# Patient Record
Sex: Female | Born: 1975 | Race: Black or African American | Hispanic: No | Marital: Married
Health system: Southern US, Community
[De-identification: ages and names within clinical notes are randomized; demographics above are authoritative.]

## PROBLEM LIST (undated history)

## (undated) DIAGNOSIS — I82409 Acute embolism and thrombosis of unspecified deep veins of unspecified lower extremity: Secondary | ICD-10-CM

## (undated) DIAGNOSIS — E079 Disorder of thyroid, unspecified: Secondary | ICD-10-CM

## (undated) DIAGNOSIS — Z6830 Body mass index (BMI) 30.0-30.9, adult: Secondary | ICD-10-CM

## (undated) HISTORY — DX: Body mass index (BMI) 30.0-30.9, adult: Z68.30

## (undated) HISTORY — PX: TUBAL LIGATION: SHX77

## (undated) HISTORY — DX: Disorder of thyroid, unspecified: E07.9

---

## 2019-06-13 ENCOUNTER — Ambulatory Visit: Payer: Self-pay | Admitting: Family Medicine

## 2019-06-16 ENCOUNTER — Ambulatory Visit: Payer: Self-pay | Admitting: Family Medicine

## 2019-06-17 ENCOUNTER — Encounter: Payer: Self-pay | Admitting: Family Medicine

## 2019-07-21 ENCOUNTER — Ambulatory Visit (INDEPENDENT_AMBULATORY_CARE_PROVIDER_SITE_OTHER): Payer: Self-pay | Admitting: Family Medicine

## 2019-07-21 ENCOUNTER — Encounter: Payer: Self-pay | Admitting: Family Medicine

## 2019-07-21 ENCOUNTER — Other Ambulatory Visit: Payer: Self-pay

## 2019-07-21 VITALS — BP 129/87 | HR 83 | Temp 97.9°F | Ht 66.0 in | Wt 184.0 lb

## 2019-07-21 DIAGNOSIS — R7303 Prediabetes: Secondary | ICD-10-CM

## 2019-07-21 DIAGNOSIS — E049 Nontoxic goiter, unspecified: Secondary | ICD-10-CM

## 2019-07-21 DIAGNOSIS — Z8639 Personal history of other endocrine, nutritional and metabolic disease: Secondary | ICD-10-CM

## 2019-07-21 NOTE — Progress Notes (Signed)
5/3/20212:50 PM  Gabriella Thomas 09/14/1975, 44 y.o., female [id number]  Chief Complaint  Patient presents with  . Hypothyroidism    does not recall medication she has taken in the past  . New Patient (Initial Visit)    HPI:   Patient is a 44 y.o. female with past medical history significant for hyperthyroidism, GDM who presents today to establish care  Moved July 2020 from [place] to [place] for her husband's work Has 4 children: twin boys 1, 2 daughter 62 and 58 Stays at home  Last OV with PCP was told she was borderline  Her previous endocrinologist:  Dr Randa Evens _____ [phone] Per last pharmacy: methimazole 10mg  BID Last took medication over 6 months ago Denies any h/o thyroid surgeries or nodules  Her PCP: [facility] MD [phone]  S/p BTL in 2011 Last pap "long-time" ago Menses regular, normal flow   Depression screen PHQ 2/9 07/21/2019  Decreased Interest 0  Down, Depressed, Hopeless 0  PHQ - 2 Score 0    Fall Risk  07/21/2019  Falls in the past year? 0  Number falls in past yr: 0  Injury with Fall? 0  Follow up Falls evaluation completed     No Known Allergies  Prior to Admission medications   Not on File    Past Medical History:  Diagnosis Date  . Thyroid disease     Past Surgical History:  Procedure Laterality Date  . CESAREAN SECTION  07/12/2005   06/12/2008, 11/15/2009    Social History   Tobacco Use  . Smoking status: Never Smoker  . Smokeless tobacco: Never Used  Substance Use Topics  . Alcohol use: Never    History reviewed. No pertinent family history.  Review of Systems  Constitutional: Positive for malaise/fatigue. Negative for chills, diaphoresis and fever.  Eyes: Negative for blurred vision and double vision.  Respiratory: Negative for cough and shortness of breath.   Cardiovascular: Positive for palpitations (intermittently). Negative for chest pain and leg swelling.  Gastrointestinal:  Negative for abdominal pain, blood in stool, constipation, diarrhea, melena, nausea and vomiting.  Genitourinary: Negative for frequency and urgency.  Skin:       Hair loss, brittle nails  Neurological: Positive for dizziness. Negative for tingling, focal weakness and headaches.  Endo/Heme/Allergies: Negative for polydipsia.  Psychiatric/Behavioral: Negative for depression. The patient is not nervous/anxious and does not have insomnia.      OBJECTIVE:  Today's Vitals   07/21/19 1435  BP: 129/87  Pulse: 83  Temp: 97.9 F (36.6 C)  SpO2: 99%  Weight: 184 lb (83.5 kg)  Height: 5\' 6"  (1.676 m)   Body mass index is 29.7 kg/m.   Physical Exam Vitals and nursing note reviewed.  Constitutional:      Appearance: She is well-developed.  HENT:     Head: Normocephalic and atraumatic.     Mouth/Throat:     Pharynx: No oropharyngeal exudate.  Eyes:     General: No scleral icterus.    Conjunctiva/sclera: Conjunctivae normal.     Pupils: Pupils are equal, round, and reactive to light.  Neck:     Thyroid: Thyromegaly present. No thyroid mass or thyroid tenderness.  Cardiovascular:     Rate and Rhythm: Normal rate and regular rhythm.     Heart sounds: Normal heart sounds. No murmur. No friction rub. No gallop.   Pulmonary:     Effort: Pulmonary effort is normal.     Breath sounds: Normal breath sounds. No  wheezing or rales.  Musculoskeletal:     Cervical back: Neck supple.     Right lower leg: No edema.     Left lower leg: No edema.  Lymphadenopathy:     Cervical: No cervical adenopathy.  Skin:    General: Skin is warm and dry.     Comments: Diffuse hair thinning and breakage, no scarring  Neurological:     Mental Status: She is alert and oriented to person, place, and time.     No results found for this or any previous visit (from the past 24 hour(s)).  No results found.   ASSESSMENT and PLAN  1. History of hyperthyroidism - TSH - T4, Free  2. Goiter - US  THYROID; Future  3. Prediabetes - Hemoglobin A1c  PMH, PSH, meds, allergies, Fhx, Shx, reviewed with patient today. Records to be requested.   Return in about 3 months (around 10/21/2019) for CPE with pap.    Rutherford Guys, MD [facility] [place], [place] [address] Ph.  [phone] Fax [phone]

## 2019-07-21 NOTE — Patient Instructions (Addendum)
  Rogaine (over the counter) lotion for hair growth   If you have lab work done today you will be contacted with your lab results within the next 2 weeks.  If you have not heard from [place] then please contact us. The fastest way to get your results is to register for My Chart.   IF you received an x-ray today, you will receive an invoice from [facility]. Please contact [facility] at [phone] with questions or concerns regarding your invoice.   IF you received labwork today, you will receive an invoice from [place] [facility]. Please contact [facility] at [phone] with questions or concerns regarding your invoice.   Our billing staff will not be able to assist you with questions regarding bills from these companies.  You will be contacted with the lab results as soon as they are available. The fastest way to get your results is to activate your My Chart account. Instructions are located on the last page of this paperwork. If you have not heard from [place] regarding the results in 2 weeks, please contact this office.

## 2019-07-22 LAB — T4, FREE: Free T4: 1.3 ng/dL (ref 0.82–1.77)

## 2019-07-22 LAB — TSH: TSH: 1.78 u[IU]/mL (ref 0.450–4.500)

## 2019-07-22 LAB — HEMOGLOBIN A1C
Est. average glucose Bld gHb Est-mCnc: 117 mg/dL
Hgb A1c MFr Bld: 5.7 % — ABNORMAL HIGH (ref 4.8–5.6)

## 2019-08-01 ENCOUNTER — Other Ambulatory Visit: Payer: Self-pay

## 2019-08-05 ENCOUNTER — Other Ambulatory Visit: Payer: Self-pay

## 2019-08-14 ENCOUNTER — Other Ambulatory Visit: Payer: Self-pay

## 2019-10-13 ENCOUNTER — Emergency Department (HOSPITAL_COMMUNITY)
Admission: EM | Admit: 2019-10-13 | Discharge: 2019-10-13 | Disposition: A | Discharge status: Home / Self Care | Payer: Self-pay | Attending: Emergency Medicine | Admitting: Emergency Medicine

## 2019-10-13 ENCOUNTER — Emergency Department (HOSPITAL_BASED_OUTPATIENT_CLINIC_OR_DEPARTMENT_OTHER): Payer: Self-pay

## 2019-10-13 ENCOUNTER — Other Ambulatory Visit: Payer: Self-pay

## 2019-10-13 ENCOUNTER — Encounter (HOSPITAL_COMMUNITY): Payer: Self-pay | Admitting: Emergency Medicine

## 2019-10-13 DIAGNOSIS — I82491 Acute embolism and thrombosis of other specified deep vein of right lower extremity: Secondary | ICD-10-CM | POA: Insufficient documentation

## 2019-10-13 DIAGNOSIS — M79609 Pain in unspecified limb: Secondary | ICD-10-CM

## 2019-10-13 DIAGNOSIS — M722 Plantar fascial fibromatosis: Secondary | ICD-10-CM | POA: Insufficient documentation

## 2019-10-13 DIAGNOSIS — M7989 Other specified soft tissue disorders: Secondary | ICD-10-CM

## 2019-10-13 LAB — CBC
HCT: 40.8 % (ref 36.0–46.0)
Hemoglobin: 12.7 g/dL (ref 12.0–15.0)
MCH: 28.4 pg (ref 26.0–34.0)
MCHC: 31.1 g/dL (ref 30.0–36.0)
MCV: 91.3 fL (ref 80.0–100.0)
Platelets: 211 10*3/uL (ref 150–400)
RBC: 4.47 MIL/uL (ref 3.87–5.11)
RDW: 12.6 % (ref 11.5–15.5)
WBC: 5 10*3/uL (ref 4.0–10.5)
nRBC: 0 % (ref 0.0–0.2)

## 2019-10-13 LAB — BASIC METABOLIC PANEL
Anion gap: 8 (ref 5–15)
BUN: 12 mg/dL (ref 6–20)
CO2: 26 mmol/L (ref 22–32)
Calcium: 8.9 mg/dL (ref 8.9–10.3)
Chloride: 103 mmol/L (ref 98–111)
Creatinine, Ser: 0.41 mg/dL — ABNORMAL LOW (ref 0.44–1.00)
GFR calc Af Amer: >60 mL/min (ref >60)
GFR calc non Af Amer: >60 mL/min (ref >60)
Glucose, Bld: 92 mg/dL (ref 70–99)
Potassium: 4.3 mmol/L (ref 3.5–5.1)
Sodium: 137 mmol/L (ref 135–145)

## 2019-10-13 LAB — I-STAT BETA HCG BLOOD, ED (MC, WL, AP ONLY): I-stat hCG, quantitative: <5 m[IU]/mL (ref <5)

## 2019-10-13 MED ORDER — RIVAROXABAN 15 MG PO TABS
15.0000 mg | ORAL_TABLET | Freq: Once | ORAL | Status: AC
  Administered 2019-10-13: 15 mg via ORAL
  Filled 2019-10-13: qty 1

## 2019-10-13 MED ORDER — RIVAROXABAN (XARELTO) EDUCATION KIT FOR DVT/PE PATIENTS
PACK | Freq: Once | Status: AC
  Filled 2019-10-13: qty 1

## 2019-10-13 MED ORDER — RIVAROXABAN (XARELTO) EDUCATION KIT FOR DVT/PE PATIENTS: PACK | Freq: Once | Status: DC

## 2019-10-13 MED ORDER — RIVAROXABAN (XARELTO) VTE STARTER PACK (15 & 20 MG)
ORAL_TABLET | ORAL | 0 refills | Status: DC
Start: 2019-10-13 — End: 2019-12-18

## 2019-10-13 NOTE — ED Triage Notes (Signed)
Pt having pain in right foot that radiates right calf x 2 days. Has swelling denies injury or falls. Pain is worse when wakes up in the mornings and tries to walk on it. Taking tylenol and ibuprofen for pain

## 2019-10-13 NOTE — Discharge Instructions (Addendum)
Your work-up today showed a DVT or a blood clot in your right calf.  You have been started on a blood thinner medication.  It is very importantly follow-up with your primary care doctor within the month to follow-up.  You will need to get refills of this medication from your primary care doctor. Return to the ER for worsening chest pain, shortness of breath or any other new concerning symptoms.  Please take Tylenol for pain, avoid ibuprofen and Advil.  In terms of your foot pain, please make sure to follow-up with your primary care doctor if your symptoms do not improve.  I have provided a handout with some gentle stretching exercises.   Information on my medicine - XARELTO (rivaroxaban)  This medication education was reviewed with me or my healthcare representative as part of my discharge preparation.  The pharmacist that spoke with me during my hospital stay was:  WOFFORD, DREW A, RPH  WHY WAS XARELTO PRESCRIBED FOR YOU? Xarelto was prescribed to treat blood clots that may have been found in the veins of your legs (deep vein thrombosis) or in your lungs (pulmonary embolism) and to reduce the risk of them occurring again.  What do you need to know about Xarelto? The starting dose is one 15 mg tablet taken TWICE daily with food for the FIRST 21 DAYS then on (enter date)  11/04/19  the dose is changed to one 20 mg tablet taken ONCE A DAY with your evening meal.  DO NOT stop taking Xarelto without talking to the health care provider who prescribed the medication.  Refill your prescription for 20 mg tablets before you run out.  After discharge, you should have regular check-up appointments with your healthcare provider that is prescribing your Xarelto.  In the future your dose may need to be changed if your kidney function changes by a significant amount.  What do you do if you miss a dose? If you are taking Xarelto TWICE DAILY and you miss a dose, take it as soon as you remember. You may take  two 15 mg tablets (total 30 mg) at the same time then resume your regularly scheduled 15 mg twice daily the next day.  If you are taking Xarelto ONCE DAILY and you miss a dose, take it as soon as you remember on the same day then continue your regularly scheduled once daily regimen the next day. Do not take two doses of Xarelto at the same time.   Important Safety Information Xarelto is a blood thinner medicine that can cause bleeding. You should call your healthcare provider right away if you experience any of the following: Bleeding from an injury or your nose that does not stop. Unusual colored urine (red or dark brown) or unusual colored stools (red or black). Unusual bruising for unknown reasons. A serious fall or if you hit your head (even if there is no bleeding).  Some medicines may interact with Xarelto and might increase your risk of bleeding while on Xarelto. To help avoid this, consult your healthcare provider or pharmacist prior to using any new prescription or non-prescription medications, including herbals, vitamins, non-steroidal anti-inflammatory drugs (NSAIDs) and supplements.  This website has more information on Xarelto: VisitDestination.com.br.

## 2019-10-13 NOTE — Progress Notes (Signed)
Lower extremity venous has been completed.   Preliminary results in CV Proc.   Blanch Media 10/13/2019 5:33 PM

## 2019-10-13 NOTE — ED Provider Notes (Signed)
[facility] DEPT Provider Note   CSN: [id number] Arrival date & time: 10/13/19  1403     History Chief Complaint  Patient presents with  . Foot Pain  . Leg Pain    Gabriella Thomas is a 44 y.o. female.  HPI 44 year old female with no significant medical history presents to the ER for pain to the bottom of her foot which began 2 days ago.  Patient reports that she has significant pain with first steps in the morning, and states that as she walks, the pain improves throughout the day.  She has been taking Advil with little relief.  She also states that she has some pain in her right calf which started approximately around the same time.  She denies any recent travel, not on oral contraceptives.  No recent surgeries.  Denies any chest pain or shortness of breath.    History reviewed. No pertinent past medical history.  There are no problems to display for this patient.   History reviewed. No pertinent surgical history.   OB History   No obstetric history on file.     No family history on file.  Social History   Tobacco Use  . Smoking status: Not on file  Substance Use Topics  . Alcohol use: Not on file  . Drug use: Not on file    Home Medications Prior to Admission medications   Medication Sig Start Date End Date Taking? Authorizing Provider  RIVAROXABAN Alveda Reasons) VTE STARTER PACK (15 & 20 MG TABLETS) Follow package directions: Take one 10m tablet by mouth twice a day. On day 22, switch to one 2103mtablet once a day. Take with food. 10/13/19   BeGarald BaldingPA-C    Allergies    Patient has no known allergies.  Review of Systems   Review of Systems  Constitutional: Negative for chills and fever.  HENT: Negative for ear pain and sore throat.   Eyes: Negative for pain and visual disturbance.  Respiratory: Negative for cough and shortness of breath.   Cardiovascular: Negative for chest pain and palpitations.  Gastrointestinal:  Negative for abdominal pain and vomiting.  Genitourinary: Negative for dysuria and hematuria.  Musculoskeletal: Positive for arthralgias (Foot pain). Negative for back pain.  Skin: Negative for color change and rash.  Neurological: Negative for dizziness, seizures and syncope.  Hematological: Does not bruise/bleed easily.  All other systems reviewed and are negative.   Physical Exam Updated Vital Signs BP (!) 125/98   Pulse 78   Temp 98.3 F (36.8 C) (Oral)   Resp 18   Ht 5' 5"  (1.651 m)   Wt 80.7 kg   SpO2 100%   BMI 29.62 kg/m   Physical Exam Vitals and nursing note reviewed.  Constitutional:      General: She is not in acute distress.    Appearance: She is well-developed.  HENT:     Head: Normocephalic and atraumatic.  Eyes:     Conjunctiva/sclera: Conjunctivae normal.  Cardiovascular:     Rate and Rhythm: Normal rate and regular rhythm.     Heart sounds: No murmur heard.   Pulmonary:     Effort: Pulmonary effort is normal. No respiratory distress.     Breath sounds: Normal breath sounds.  Abdominal:     Palpations: Abdomen is soft.     Tenderness: There is no abdominal tenderness.  Musculoskeletal:        General: Tenderness present.     Cervical back: Neck supple.  Right lower leg: Edema present.     Left lower leg: Edema present.     Comments: Extensive varicose veins bilaterally in legs. Point tenderness to the right heel and plantar area. Gross sensations intact. Full ROM on ankles bilaterally. Positive Homan's sign on the right. 2+DP pulses. Mild ankle edema bilaterally.No overlying erythema, warmth   Skin:    General: Skin is warm and dry.  Neurological:     General: No focal deficit present.     Mental Status: She is alert.     ED Results / Procedures / Treatments   Labs (all labs ordered are listed, but only abnormal results are displayed) Labs Reviewed  BASIC METABOLIC PANEL - Abnormal; Notable for the following components:      Result Value    Creatinine, Ser 0.41 (*)    All other components within normal limits  CBC  I-STAT BETA HCG BLOOD, ED ([facility], [facility], [facility] ONLY)    EKG None  Radiology VAS [place] LOWER EXTREMITY VENOUS (DVT) ([facility] 7a-7p)  Result Date: 10/13/2019  Lower Venous DVTStudy Indications: Pain, and Swelling.  Comparison Study: no prior Performing Technologist: Abram Sander RVS  Examination Guidelines: A complete evaluation includes B-mode imaging, spectral Doppler, color Doppler, and power Doppler as needed of all accessible portions of each vessel. Bilateral testing is considered an integral part of a complete examination. Limited examinations for reoccurring indications may be performed as noted. The reflux portion of the exam is performed with the patient in reverse Trendelenburg.  +---------+---------------+---------+-----------+----------+-----------------+ RIGHT    CompressibilityPhasicitySpontaneityPropertiesThrombus Aging    +---------+---------------+---------+-----------+----------+-----------------+ CFV      Full           Yes      Yes                                    +---------+---------------+---------+-----------+----------+-----------------+ SFJ      Full                                                           +---------+---------------+---------+-----------+----------+-----------------+ FV Prox  Full                                                           +---------+---------------+---------+-----------+----------+-----------------+ FV Mid   Full                                                           +---------+---------------+---------+-----------+----------+-----------------+ FV DistalFull                                                           +---------+---------------+---------+-----------+----------+-----------------+ PFV      Full                                                            +---------+---------------+---------+-----------+----------+-----------------+  POP      Full           Yes      Yes                                    +---------+---------------+---------+-----------+----------+-----------------+ PTV      Partial                                      Age Indeterminate +---------+---------------+---------+-----------+----------+-----------------+ PERO                                                  Not visualized    +---------+---------------+---------+-----------+----------+-----------------+   +----+---------------+---------+-----------+----------+--------------+ LEFTCompressibilityPhasicitySpontaneityPropertiesThrombus Aging +----+---------------+---------+-----------+----------+--------------+ CFV Full           Yes      Yes                                 +----+---------------+---------+-----------+----------+--------------+     Summary: RIGHT: - Findings consistent with age indeterminate deep vein thrombosis involving the right posterior tibial veins. - No cystic structure found in the popliteal fossa.  LEFT: - No evidence of common femoral vein obstruction.  *See table(s) above for measurements and observations. Electronically signed by Ruta Hinds MD on 10/13/2019 at 7:31:33 PM.    Final     Procedures Procedures (including critical care time)  Medications Ordered in ED Medications  Rivaroxaban (XARELTO) tablet 15 mg (15 mg Oral Given 10/13/19 1904)  rivaroxaban Alveda Reasons) Education Kit for DVT/PE patients ( Does not apply Given 10/13/19 1904)    ED Course  I have reviewed the triage vital signs and the nursing notes.  Pertinent labs & imaging results that were available during my care of the patient were reviewed by me and considered in my medical decision making (see chart for details).    MDM Rules/Calculators/A&P                         44 year old female with right foot pain and calf right pain x2 days On  presentation, the patient's vital signs are stable.  Physical exam with extensive varicose veins to her lower legs bilaterally.  Denies any recent injuries.  She did have some point tenderness to her right heel and plantar area.  2+ DP pulses, <2 cap refill.  She did have a positive Homans' sign, and mild calf tenderness on the right.  DDx included plantar fasciitis, DVT, right foot fracture.  Ordered DVT ultrasound for rule out, which was positive for a DVT in the right posterior tibial veins.  Denies any chest pain or shortness of breath, vitals reassuring.  No evidence of PE.  I discussed the case with Dr. Melina Copa, will start on anticoagulation after reviewing her renal function and platelet count which were all normal.  Patient had a negative pregnancy test.  I do think that part of her pain is coming from plantar fasciitis given that she is having pain first thing in the morning, which improves throughout the day.  I encouraged her to take Tylenol for pain, and gave her a stretching exercise handout.  I  encouraged her to follow-up with her PCP within the month for further evaluation.  I encouraged the patient to follow-up with Ortho if her symptoms do not improve.  Strict return precautions discussed which included worsening shortness of breath or chest pain.  Discussed the case with Dr. Melina Copa who is agreeable to the above plan and disposition   Final Clinical Impression(s) / ED Diagnoses Final diagnoses:  Plantar fasciitis of right foot  Acute deep vein thrombosis (DVT) of other specified vein of right lower extremity [facility])    Rx / DC Orders ED Discharge Orders         Ordered    RIVAROXABAN (XARELTO) VTE STARTER PACK (15 & 20 MG TABLETS)     Discontinue  Reprint     10/13/19 1914           Lyndel Safe 10/13/19 1938    Hayden Rasmussen, MD 10/14/19 1230

## 2019-10-14 ENCOUNTER — Encounter: Payer: Self-pay | Admitting: Family Medicine

## 2019-11-18 ENCOUNTER — Other Ambulatory Visit (HOSPITAL_COMMUNITY): Payer: Self-pay | Admitting: Emergency Medicine

## 2019-11-18 ENCOUNTER — Encounter (HOSPITAL_COMMUNITY): Payer: Self-pay | Admitting: Emergency Medicine

## 2019-11-18 ENCOUNTER — Other Ambulatory Visit: Payer: Self-pay

## 2019-11-18 ENCOUNTER — Emergency Department (HOSPITAL_COMMUNITY)
Admission: EM | Admit: 2019-11-18 | Discharge: 2019-11-18 | Disposition: A | Discharge status: Home / Self Care | Payer: BC Managed Care – PPO | Attending: Emergency Medicine | Admitting: Emergency Medicine

## 2019-11-18 ENCOUNTER — Ambulatory Visit: Payer: Self-pay | Admitting: Family Medicine

## 2019-11-18 DIAGNOSIS — I82461 Acute embolism and thrombosis of right calf muscular vein: Secondary | ICD-10-CM

## 2019-11-18 DIAGNOSIS — I82401 Acute embolism and thrombosis of unspecified deep veins of right lower extremity: Secondary | ICD-10-CM | POA: Insufficient documentation

## 2019-11-18 DIAGNOSIS — M79604 Pain in right leg: Secondary | ICD-10-CM | POA: Diagnosis present

## 2019-11-18 HISTORY — DX: Acute embolism and thrombosis of unspecified deep veins of unspecified lower extremity: I82.409

## 2019-11-18 MED ORDER — RIVAROXABAN 20 MG PO TABS
20.0000 mg | ORAL_TABLET | Freq: Once | ORAL | Status: AC
  Administered 2019-11-18: 20 mg via ORAL
  Filled 2019-11-18 (×2): qty 1

## 2019-11-18 MED ORDER — RIVAROXABAN 20 MG PO TABS
20.0000 mg | ORAL_TABLET | Freq: Every day | ORAL | 2 refills | Status: DC
Start: 2019-11-18 — End: 2019-12-18

## 2019-11-18 MED ORDER — RIVAROXABAN 15 MG PO TABS: 15.0000 mg | ORAL_TABLET | Freq: Once | ORAL | Status: DC

## 2019-11-18 NOTE — ED Provider Notes (Signed)
[facility]-EMERGENCY DEPT Provider Note   CSN: [id number] Arrival date & time: 11/18/19  1614     History Chief Complaint  Patient presents with  . Leg Pain    Gabriella Thomas is a 44 y.o. female.  HPI    Patient presents almost 1 week after running out of her medication for recently diagnosed deep vein thrombosis in the right leg. She notes that she was unable to obtain a refill.  In the interval since running out of medication she developed soreness in her right calf, and pain in the right lower foot.  No new chest pain, dyspnea, or other complaints. Pain is worse with ambulation, marginally better resting. Past Medical History:  Diagnosis Date  . DVT (deep venous thrombosis) (HCC)   . Thyroid disease     There are no problems to display for this patient.   Past Surgical History:  Procedure Laterality Date  . CESAREAN SECTION  07/12/2005   06/12/2008, 11/15/2009     OB History   No obstetric history on file.     No family history on file.  Social History   Tobacco Use  . Smoking status: Never Smoker  . Smokeless tobacco: Never Used  Substance Use Topics  . Alcohol use: Never  . Drug use: Never    Home Medications Prior to Admission medications   Medication Sig Start Date End Date Taking? Authorizing Provider  RIVAROXABAN Carlena Hurl) VTE STARTER PACK (15 & 20 MG TABLETS) Follow package directions: Take one 15mg  tablet by mouth twice a day. On day 22, switch to one 20mg  tablet once a day. Take with food. 10/13/19   , PA-C    Allergies    Patient has no known allergies.  Review of Systems   Review of Systems  Constitutional:       Per HPI, otherwise negative  HENT:       Per HPI, otherwise negative  Respiratory:       Per HPI, otherwise negative  Cardiovascular:       Per HPI, otherwise negative  Gastrointestinal: Negative for nausea.  Endocrine:       Negative aside from HPI  Genitourinary:       Neg aside  from HPI   Musculoskeletal:       Per HPI, otherwise negative  Skin: Negative.   Neurological: Negative for syncope.  Hematological:       Per HPI  - DVT    Physical Exam Updated Vital Signs BP (!) 145/93 (BP Location: Left Arm)   Pulse 84   Temp 99.8 F (37.7 C) (Oral)   Resp 18   SpO2 100%   Physical Exam Vitals and nursing note reviewed.  Constitutional:      General: She is not in acute distress.    Appearance: She is well-developed.  HENT:     Head: Normocephalic and atraumatic.  Eyes:     Conjunctiva/sclera: Conjunctivae normal.  Cardiovascular:     Rate and Rhythm: Normal rate and regular rhythm.     Pulses: Normal pulses.  Pulmonary:     Effort: Pulmonary effort is normal. No respiratory distress.     Breath sounds: No stridor.  Abdominal:     General: There is no distension.  Musculoskeletal:       Legs:  Skin:    General: Skin is warm and dry.  Neurological:     Mental Status: She is alert and oriented to person, place, and time.  Cranial Nerves: No cranial nerve deficit.     ED Results / Procedures / Treatments   Chart review notable for evaluation 1 month ago with diagnosis of DVT. Procedures Procedures (including critical care time)  Medications Ordered in ED Medications - No data to display  ED Course  I have reviewed the triage vital signs and the nursing notes.  Pertinent labs & imaging results that were available during my care of the patient were reviewed by me and considered in my medical decision making (see chart for details).  After initial evaluation discussed the patient presents with her pharmacist to ensure appropriate resumption of dosing, and with social work to assist with obtaining her medication.  Though she does have right calf pain, she may be experiencing symptoms from residual DVT.  No distal neurovascular compromise, no inability to walk, no chest pain, no respiratory symptoms suggesting PE.  Patient discharged in  stable condition. Final Clinical Impression(s) / ED Diagnoses Final diagnoses:  Deep vein thrombosis (DVT) of calf muscle vein of right lower extremity, unspecified chronicity (HCC)     Gerhard Munch, MD 11/18/19 1858

## 2019-11-18 NOTE — Progress Notes (Addendum)
TOC CM referral for follow up on Xarelto. Pt will need to get [facility]. Please send Rx to that clinic. She can receive 2 fills for $10 and the pharmacy will assist pt in getting paperwork completed to participate in the Xarelto assistance through the drug company. Contacted [facility] and appt scheduled for 12/18/2019 at 130 pm. Pt states she is going to cancel appt at [facility]. She cannot afford to pay out of pocket. Provided pt with applications for Xarelto to complete and take with her when she picks up medication on tomorrow at [facility]. The medication assistance counselor will assist her with getting application completed and faxed to drug company. She may be eligible to get medication for a year if she qualifies. ED provider updated.  Isidoro Donning RN CCM, [facility] ED TOC CM [phone]

## 2019-11-18 NOTE — Discharge Instructions (Addendum)
Please be sure to keep your appointment next month for appropriate ongoing management.  Return here for concerning changes in your condition.

## 2019-11-18 NOTE — ED Triage Notes (Signed)
Needs a refill on her Xerelto-was given for right DVT states she needs to be on it for 3 months-does not have insurance to pay for meds

## 2019-11-19 ENCOUNTER — Ambulatory Visit: Payer: Self-pay | Admitting: Registered Nurse

## 2019-11-19 MED FILL — XARELTO 20 MG TABLET: 20 | 30 days supply | Qty: 30 | Fill #0

## 2019-11-25 ENCOUNTER — Ambulatory Visit: Payer: Self-pay | Admitting: Family Medicine

## 2019-11-26 ENCOUNTER — Encounter: Payer: Self-pay | Admitting: Family Medicine

## 2019-12-17 MED FILL — XARELTO 20 MG TABLET: 20 | 30 days supply | Qty: 30 | Fill #1

## 2019-12-17 NOTE — Progress Notes (Signed)
Patient ID: Gabriella Thomas, female   DOB: 10-29-1975, 44 y.o.   MRN: [id number]   Virtual Visit via Telephone Note  I connected with Gabriella Thomas on 12/18/19 at  1:30 PM EDT by telephone and verified that I am speaking with the correct person using two identifiers.   I discussed the limitations, risks, security and privacy concerns of performing an evaluation and management service by telephone and the availability of in person appointments. I also discussed with the patient that there may be a patient responsible charge related to this service. The patient expressed understanding and agreed to proceed.  PATIENT visit by telephone virtually in the context of Covid-19 pandemic. Patient location:  home My Location:  [facility] office Persons on the call:  Me, the patient, and Carolynne Edouard    History of Present Illness: After ED visit 11/18/2019 bc h/o DVT and out of xarelto.  First identified DVT in July 2021.  No CP.  No SOB.  Leg pain and swelling has resolved.  She was told she would need to be on xarelto 3 months to 3 years.  Mildly elevated A1C at 5.7 in May 2021; other labs unremarkable.  Non-smoker  From A/P: Pertinent labs & imaging results that were available during my care of the patient were reviewed by me and considered in my medical decision making (see chart for details).  After initial evaluation discussed the patient presents with her pharmacist to ensure appropriate resumption of dosing, and with social work to assist with obtaining her medication.  Though she does have right calf pain, she may be experiencing symptoms from residual DVT.  No distal neurovascular compromise, no inability to walk, no chest pain, no respiratory symptoms suggesting PE.  Patient discharged in stable condition.   Observations/Objective:  NAD.  A&Ox3   Assessment and Plan:   1. Deep vein thrombosis (DVT) of distal vein of lower extremity, unspecified chronicity, unspecified laterality (HCC) May need  coagulopathy work up - rivaroxaban (XARELTO) 20 MG TABS tablet; Take 1 tablet (20 mg total) by mouth daily with supper.  Dispense: 30 tablet; Refill: 2  2. Encounter for examination following treatment at hospital Doing well    Follow Up Instructions: Assign PCP in 2 months   I discussed the assessment and treatment plan with the patient. The patient was provided an opportunity to ask questions and all were answered. The patient agreed with the plan and demonstrated an understanding of the instructions.   The patient was advised to call back or seek an in-person evaluation if the symptoms worsen or if the condition fails to improve as anticipated.  I provided 13 minutes of non-face-to-face time during this encounter.   Georgian Co, PA-C

## 2019-12-18 ENCOUNTER — Ambulatory Visit: Payer: Self-pay | Attending: Physician Assistant | Admitting: Physician Assistant

## 2019-12-18 ENCOUNTER — Encounter: Payer: Self-pay | Admitting: Physician Assistant

## 2019-12-18 DIAGNOSIS — Z09 Encounter for follow-up examination after completed treatment for conditions other than malignant neoplasm: Secondary | ICD-10-CM

## 2019-12-18 DIAGNOSIS — I824Z9 Acute embolism and thrombosis of unspecified deep veins of unspecified distal lower extremity: Secondary | ICD-10-CM | POA: Insufficient documentation

## 2019-12-18 MED ORDER — RIVAROXABAN 20 MG PO TABS: 20.0000 mg | ORAL_TABLET | Freq: Every day | ORAL | 2 refills | Status: DC

## 2020-01-16 MED FILL — XARELTO 20 MG TABLET: 20 | 30 days supply | Qty: 30 | Fill #2

## 2020-02-04 ENCOUNTER — Ambulatory Visit (INDEPENDENT_AMBULATORY_CARE_PROVIDER_SITE_OTHER): Payer: BC Managed Care – PPO | Admitting: Family Medicine

## 2020-02-04 ENCOUNTER — Encounter: Payer: Self-pay | Admitting: Family Medicine

## 2020-02-04 ENCOUNTER — Other Ambulatory Visit: Payer: Self-pay | Admitting: Family Medicine

## 2020-02-04 ENCOUNTER — Other Ambulatory Visit: Payer: Self-pay

## 2020-02-04 VITALS — BP 120/84 | HR 82 | Temp 98.2°F | Ht 65.0 in | Wt 187.0 lb

## 2020-02-04 DIAGNOSIS — I824Z9 Acute embolism and thrombosis of unspecified deep veins of unspecified distal lower extremity: Secondary | ICD-10-CM | POA: Diagnosis not present

## 2020-02-04 DIAGNOSIS — E049 Nontoxic goiter, unspecified: Secondary | ICD-10-CM | POA: Diagnosis not present

## 2020-02-04 DIAGNOSIS — Z8639 Personal history of other endocrine, nutritional and metabolic disease: Secondary | ICD-10-CM

## 2020-02-04 MED ORDER — RIVAROXABAN 20 MG PO TABS: 20.0000 mg | ORAL_TABLET | Freq: Every day | ORAL | 2 refills | Status: DC

## 2020-02-04 NOTE — Patient Instructions (Addendum)
Continue with Xarelto  Schedule pap when able  Need to get leg and thyroid ultrasounds, let me know if you have any issues   Health Maintenance, Female Adopting a healthy lifestyle and getting preventive care are important in promoting health and wellness. Ask your health care provider about:  The right schedule for you to have regular tests and exams.  Things you can do on your own to prevent diseases and keep yourself healthy. What should I know about diet, weight, and exercise? Eat a healthy diet   Eat a diet that includes plenty of vegetables, fruits, low-fat dairy products, and lean protein.  Do not eat a lot of foods that are high in solid fats, added sugars, or sodium. Maintain a healthy weight Body mass index (BMI) is used to identify weight problems. It estimates body fat based on height and weight. Your health care provider can help determine your BMI and help you achieve or maintain a healthy weight. Get regular exercise Get regular exercise. This is one of the most important things you can do for your health. Most adults should:  Exercise for at least 150 minutes each week. The exercise should increase your heart rate and make you sweat (moderate-intensity exercise).  Do strengthening exercises at least twice a week. This is in addition to the moderate-intensity exercise.  Spend less time sitting. Even light physical activity can be beneficial. Watch cholesterol and blood lipids Have your blood tested for lipids and cholesterol at 46 years of age, then have this test every 5 years. Have your cholesterol levels checked more often if:  Your lipid or cholesterol levels are high.  You are older than 46 years of age.  You are at high risk for heart disease. What should I know about cancer screening? Depending on your health history and family history, you may need to have cancer screening at various ages. This may include screening for:  Breast cancer.  Cervical  cancer.  Colorectal cancer.  Skin cancer.  Lung cancer. What should I know about heart disease, diabetes, and high blood pressure? Blood pressure and heart disease  High blood pressure causes heart disease and increases the risk of stroke. This is more likely to develop in people who have high blood pressure readings, are of African descent, or are overweight.  Have your blood pressure checked: ? Every 3-5 years if you are 60-33 years of age. ? Every year if you are 51 years old or older. Diabetes Have regular diabetes screenings. This checks your fasting blood sugar level. Have the screening done:  Once every three years after age 22 if you are at a normal weight and have a low risk for diabetes.  More often and at a younger age if you are overweight or have a high risk for diabetes. What should I know about preventing infection? Hepatitis B If you have a higher risk for hepatitis B, you should be screened for this virus. Talk with your health care provider to find out if you are at risk for hepatitis B infection. Hepatitis C Testing is recommended for:  Everyone born from [birth year] through 1967.  Anyone with known risk factors for hepatitis C. Sexually transmitted infections (STIs)  Get screened for STIs, including gonorrhea and chlamydia, if: ? You are sexually active and are younger than 46 years of age. ? You are older than 46 years of age and your health care provider tells you that you are at risk for this type of infection. ?  Your sexual activity has changed since you were last screened, and you are at increased risk for chlamydia or gonorrhea. Ask your health care provider if you are at risk.  Ask your health care provider about whether you are at high risk for HIV. Your health care provider may recommend a prescription medicine to help prevent HIV infection. If you choose to take medicine to prevent HIV, you should first get tested for HIV. You should then be tested every 3  months for as long as you are taking the medicine. Pregnancy  If you are about to stop having your period (premenopausal) and you may become pregnant, seek counseling before you get pregnant.  Take 400 to 800 micrograms (mcg) of folic acid every day if you become pregnant.  Ask for birth control (contraception) if you want to prevent pregnancy. Osteoporosis and menopause Osteoporosis is a disease in which the bones lose minerals and strength with aging. This can result in bone fractures. If you are 74 years old or older, or if you are at risk for osteoporosis and fractures, ask your health care provider if you should:  Be screened for bone loss.  Take a calcium or vitamin D supplement to lower your risk of fractures.  Be given hormone replacement therapy (HRT) to treat symptoms of menopause. Follow these instructions at home: Lifestyle  Do not use any products that contain nicotine or tobacco, such as cigarettes, e-cigarettes, and chewing tobacco. If you need help quitting, ask your health care provider.  Do not use street drugs.  Do not share needles.  Ask your health care provider for help if you need support or information about quitting drugs. Alcohol use  Do not drink alcohol if: ? Your health care provider tells you not to drink. ? You are pregnant, may be pregnant, or are planning to become pregnant.  If you drink alcohol: ? Limit how much you use to 0-1 drink a day. ? Limit intake if you are breastfeeding.  Be aware of how much alcohol is in your drink. In the [place] one drink equals one 12 oz bottle of beer (355 mL), one 5 oz glass of wine (148 mL), or one 1 oz glass of hard liquor (44 mL). General instructions  Schedule regular health, dental, and eye exams.  Stay current with your vaccines.  Tell your health care provider if: ? You often feel depressed. ? You have ever been abused or do not feel safe at home. Summary  Adopting a healthy lifestyle and getting  preventive care are important in promoting health and wellness.  Follow your health care provider's instructions about healthy diet, exercising, and getting tested or screened for diseases.  Follow your health care provider's instructions on monitoring your cholesterol and blood pressure. This information is not intended to replace advice given to you by your health care provider. Make sure you discuss any questions you have with your health care provider. Document Revised: 02/27/2018 Document Reviewed: 02/27/2018 Elsevier Patient Education  [employer].     If you have lab work done today you will be contacted with your lab results within the next 2 weeks.  If you have not heard from [place] then please contact us. The fastest way to get your results is to register for My Chart.   IF you received an x-ray today, you will receive an invoice from [facility]. Please contact [facility] at [phone] with questions or concerns regarding your invoice.   IF you received  labwork today, you will receive an invoice from [employer]. Please contact [facility] at [phone] with questions or concerns regarding your invoice.   Our billing staff will not be able to assist you with questions regarding bills from these companies.  You will be contacted with the lab results as soon as they are available. The fastest way to get your results is to activate your My Chart account. Instructions are located on the last page of this paperwork. If you have not heard from [place] regarding the results in 2 weeks, please contact this office.

## 2020-02-04 NOTE — Progress Notes (Addendum)
11/17/20213:27 PM  Gabriella Thomas 1975/08/28, 44 y.o., female [id number]  Chief Complaint  Patient presents with  . dvt follow up    headaches eod  . reorder thyroid US from 5/21    HPI:   Patient is a 44 y.o. female with past medical history significant for hyperthyroidism, GDM who presents today to follow up.  Hyperthyroid [place] of thyroid placed in May, this was never done Was previously on medication for this  Saw a specialist for this.  Endocrinology:Dr Randa Evens _____ [phone] Per last pharmacy: methimazole 10mg  BID Last took medication over 6 months ago Denies any h/o thyroid surgeries or nodules Last appointment 2 years ago  DVT Xarelto started by ED Diagnosed in August by ED Need Ultrasound of legs q 3 months until blood clot resolves Has been having headaches Takes Tylenol for this  Needs pap.  Depression screen [facility] 2/9 02/04/2020 12/18/2019 07/21/2019  Decreased Interest 0 0 0  Down, Depressed, Hopeless 0 0 0  PHQ - 2 Score 0 0 0    Fall Risk  02/04/2020 12/18/2019 07/21/2019  Falls in the past year? 0 0 0  Number falls in past yr: 0 0 0  Injury with Fall? 0 - 0  Follow up Falls evaluation completed - Falls evaluation completed     No Known Allergies  Prior to Admission medications   Medication Sig Start Date End Date Taking? Authorizing Provider  rivaroxaban (XARELTO) 20 MG TABS tablet Take 1 tablet (20 mg total) by mouth daily with supper. 12/18/19   12/20/19, PA-C    Past Medical History:  Diagnosis Date  . DVT (deep venous thrombosis) (HCC)   . Thyroid disease     Past Surgical History:  Procedure Laterality Date  . CESAREAN SECTION  07/12/2005   06/12/2008, 11/15/2009    Social History   Tobacco Use  . Smoking status: Never Smoker  . Smokeless tobacco: Never Used  Substance Use Topics  . Alcohol use: Never    History reviewed. No pertinent family history.  Review of Systems  Constitutional: Negative for chills, fever  and malaise/fatigue.  Eyes: Negative for blurred vision and double vision.  Respiratory: Negative for cough, shortness of breath and wheezing.   Cardiovascular: Negative for chest pain, palpitations and leg swelling.  Gastrointestinal: Negative for abdominal pain, blood in stool, constipation, diarrhea, heartburn, nausea and vomiting.  Genitourinary: Negative for dysuria, frequency and hematuria.  Musculoskeletal: Negative for back pain and joint pain.  Skin: Negative for rash.  Neurological: Positive for headaches. Negative for dizziness, tingling, tremors and weakness.     OBJECTIVE:  Today's Vitals   02/04/20 1010  BP: 120/84  Pulse: 82  Temp: 98.2 F (36.8 C)  SpO2: 97%  Weight: 187 lb (84.8 kg)  Height: 5\' 5"  (1.651 m)   Body mass index is 31.12 kg/m.   Physical Exam Constitutional:      General: She is not in acute distress.    Appearance: Normal appearance. She is not ill-appearing.  HENT:     Head: Normocephalic.  Cardiovascular:     Rate and Rhythm: Normal rate and regular rhythm.     Pulses: Normal pulses.     Heart sounds: Normal heart sounds. No murmur heard.  No friction rub. No gallop.   Pulmonary:     Effort: Pulmonary effort is normal. No respiratory distress.     Breath sounds: Normal breath sounds. No stridor. No wheezing, rhonchi or rales.  Abdominal:  General: Bowel sounds are normal.     Palpations: Abdomen is soft.     Tenderness: There is no abdominal tenderness.  Musculoskeletal:     Right lower leg: No edema.     Left lower leg: No edema.  Skin:    General: Skin is warm and dry.  Neurological:     Mental Status: She is alert and oriented to person, place, and time.  Psychiatric:        Mood and Affect: Mood normal.        Behavior: Behavior normal.     No results found for this or any previous visit (from the past 24 hour(s)).  No results found.   ASSESSMENT and PLAN  Problem List Items Addressed This Visit       Cardiovascular and Mediastinum   Deep vein thrombosis (DVT) of distal vein of lower extremity (HCC) - Primary   Relevant Medications   rivaroxaban (XARELTO) 20 MG TABS tablet   Other Relevant Orders   VAS [place] LOWER EXTREMITY VENOUS (DVT)   Ambulatory referral to Hematology   US Venous Img Lower Bilateral  Discussed the need to continue xarelto until her lower extremity ultrasound no longer shows a DVT. Refills ordred. Discussed r/se/b of this medication.    Other Visit Diagnoses    Goiter and History of hyperthyroidism      Relevant Orders   US THYROID   TSH   Ambulatory referral to Endocrinology  Was previously on medication for this approx 6 months ago. Has not had endocrine follow up in 2 years. Will follow up with lab results.         Discussed need to schedule pap.   Return in about 1 month (around 03/05/2020) for Ultrasound follow up.    Macario Carls Dionel Archey, FNP-BC Primary Care at [facility] [address] [place], [place] [address] Ph.  [phone] Fax [phone]  I have reviewed and agree with above documentation. Edwina Barth, MD

## 2020-02-05 LAB — TSH: TSH: 1.91 u[IU]/mL (ref 0.450–4.500)

## 2020-02-05 NOTE — Progress Notes (Signed)
Hello, If you could let Gabriella Thomas know her TSH is normal. Also I placed a referral for endocrinology and hematology for her so she should be hearing from them soon. There were also orders placed for her leg ultrasounds and thyroid ultrasound. Thanks!

## 2020-02-16 MED FILL — XARELTO 20 MG TABLET: 20 | 30 days supply | Qty: 30 | Fill #0

## 2020-02-17 ENCOUNTER — Telehealth: Payer: Self-pay | Admitting: Hematology

## 2020-02-17 NOTE — Telephone Encounter (Signed)
Received a new hem referral from [facility] Just, FNP for DVT. Pt has been cld and scheduled to see Dr. Candise Che on 12/21 at 11am. Pt aware to arrive 30 minutes early.

## 2020-02-20 ENCOUNTER — Other Ambulatory Visit: Payer: Self-pay | Admitting: Family Medicine

## 2020-02-20 ENCOUNTER — Ambulatory Visit (HOSPITAL_COMMUNITY)
Admission: RE | Admit: 2020-02-20 | Discharge: 2020-02-20 | Disposition: A | Discharge status: Home / Self Care | Payer: BC Managed Care – PPO | Source: Ambulatory Visit | Attending: Family Medicine | Admitting: Family Medicine

## 2020-02-20 ENCOUNTER — Ambulatory Visit (HOSPITAL_COMMUNITY): Payer: BC Managed Care – PPO

## 2020-02-20 ENCOUNTER — Other Ambulatory Visit: Payer: Self-pay

## 2020-02-20 DIAGNOSIS — I824Z9 Acute embolism and thrombosis of unspecified deep veins of unspecified distal lower extremity: Secondary | ICD-10-CM | POA: Diagnosis present

## 2020-02-20 NOTE — Progress Notes (Signed)
Lower venous order changed from bilateral to right lower extremity. Okay to change per Maia Breslow, RN.   Lower extremity venous RT study completed.   Please see CV Proc for preliminary results.   Jean Rosenthal, RDMS

## 2020-02-26 ENCOUNTER — Ambulatory Visit (INDEPENDENT_AMBULATORY_CARE_PROVIDER_SITE_OTHER): Payer: BC Managed Care – PPO | Admitting: Family Medicine

## 2020-02-26 ENCOUNTER — Telehealth (INDEPENDENT_AMBULATORY_CARE_PROVIDER_SITE_OTHER): Payer: BC Managed Care – PPO | Admitting: Family Medicine

## 2020-02-26 ENCOUNTER — Other Ambulatory Visit: Payer: Self-pay

## 2020-02-26 ENCOUNTER — Encounter: Payer: Self-pay | Admitting: Family Medicine

## 2020-02-26 DIAGNOSIS — R059 Cough, unspecified: Secondary | ICD-10-CM | POA: Diagnosis not present

## 2020-02-26 MED ORDER — MUCINEX DM MAXIMUM STRENGTH 60-1200 MG PO TB12: 1.0000 | ORAL_TABLET | Freq: Two times a day (BID) | ORAL | 0 refills | Status: DC

## 2020-02-26 MED ORDER — BENZONATATE 100 MG PO CAPS: 100.0000 mg | ORAL_CAPSULE | Freq: Three times a day (TID) | ORAL | 0 refills | Status: DC | PRN

## 2020-02-26 NOTE — Progress Notes (Addendum)
Virtual Visit Note  I connected with patient on 02/26/20 at 1611 by telephone due to unable to work Epic video visit and verified that I am speaking with the correct person using two identifiers. Gabriella Thomas is currently located at home and no family members are currently with them during visit. The provider, Azalee Course Lyndi Holbein, FNP is located in their office at time of visit.  I discussed the limitations, risks, security and privacy concerns of performing an evaluation and management service by telephone and the availability of in person appointments. I also discussed with the patient that there may be a patient responsible charge related to this service. The patient expressed understanding and agreed to proceed.   I provided 20 minutes of non-face-to-face time during this encounter.  Chief Complaint  Patient presents with  . Cough    X 3 days fever, fatigue, headache - otc tylenol q 6 hrs     HPI ? Will see hematology 12/21 Has been having a cough for 3 days and feels febrile (doesn't have thermometer)with headache, fatigue Has been taking Tylenol every 6 hours  Not vaccinated for covid or flu   No Known Allergies  Prior to Admission medications   Medication Sig Start Date End Date Taking? Authorizing Provider  rivaroxaban (XARELTO) 20 MG TABS tablet Take 1 tablet (20 mg total) by mouth daily with supper. 02/04/20  Yes Elide Stalzer, Azalee Course, FNP    Past Medical History:  Diagnosis Date  . DVT (deep venous thrombosis) (HCC)   . Thyroid disease     Past Surgical History:  Procedure Laterality Date  . CESAREAN SECTION  07/12/2005   06/12/2008, 11/15/2009    Social History   Tobacco Use  . Smoking status: Never Smoker  . Smokeless tobacco: Never Used  Substance Use Topics  . Alcohol use: Never    History reviewed. No pertinent family history.  Review of Systems  Constitutional: Positive for chills, fever and malaise/fatigue.  HENT: Positive for sinus pain and sore  throat. Negative for congestion and ear pain.   Respiratory: Positive for cough. Negative for sputum production, shortness of breath and wheezing.   Cardiovascular: Negative for chest pain.  Neurological: Positive for headaches.    Objective  Constitutional:      General: She is not in acute distress.    Appearance: Normal appearance. She is not ill-appearing.   Pulmonary:     Effort: Pulmonary effort is normal. No respiratory distress.  Neurological:     Mental Status: She is alert and oriented to person, place, and time.  Psychiatric:        Mood and Affect: Mood normal.        Behavior: Behavior normal.     ASSESSMENT and PLAN  Problem List Items Addressed This Visit   None   Visit Diagnoses    Cough    -  Primary   Relevant Medications   benzonatate (TESSALON) 100 MG capsule   Dextromethorphan-guaiFENesin (MUCINEX DM MAXIMUM STRENGTH) 60-1200 MG TB12   Other Relevant Orders   Novel Coronavirus, NAA ([facility])   Influenza A/B  Will follow up with lab results  R/se/b of medications discussed RTC and ED precautions provided  Conservative measures encouraged:  sore throat, gargle with salt water. Do this 3-4 times per day or as needed. To make a salt-water mixture, dissolve -1 tsp of salt in 1 cup of warm water. Make sure that all the salt dissolves.  Use nose drops made from salt water. This  helps with stuffiness (congestion). It also helps soften the skin around your nose.  Drink enough fluid to keep your pee (urine) pale yellow.  Rest  Treat fever with Tylenol  Humidifier         Return if symptoms worsen or fail to improve, for next scheduled visit.   The above assessment and management plan was discussed with the patient. The patient verbalized understanding of and has agreed to the management plan. Patient is aware to call the clinic if symptoms persist or worsen. Patient is aware when to return to the clinic for a follow-up visit. Patient educated  on when it is appropriate to go to the emergency department.    Macario Carls Kaamil Morefield, FNP-BC Primary Care at [facility] [address] [place], [place] [address] Ph.  [phone] Fax [phone]  I have reviewed and agree with above documentation. Edwina Barth, MD

## 2020-02-26 NOTE — Addendum Note (Signed)
Addended by: Argentina Ponder on: 02/26/2020 04:58 PM   Modules accepted: Orders

## 2020-02-26 NOTE — Progress Notes (Signed)
Pt had a virtual appt today for cough and drove up for testing .

## 2020-02-26 NOTE — Addendum Note (Signed)
Addended by: Argentina Ponder on: 02/26/2020 04:53 PM   Modules accepted: Orders

## 2020-02-26 NOTE — Patient Instructions (Addendum)
Viral Respiratory Infection A viral respiratory infection is an illness that affects parts of the body that are used for breathing. These include the lungs, nose, and throat. It is caused by a germ called a virus. Some examples of this kind of infection are:  A cold.  The flu (influenza).  A respiratory syncytial virus (RSV) infection. A person who gets this illness may have the following symptoms:  A stuffy or runny nose.  Yellow or green fluid in the nose.  A cough.  Sneezing.  Tiredness (fatigue).  Achy muscles.  A sore throat.  Sweating or chills.  A fever.  A headache. Follow these instructions at home: Managing pain and congestion  Take over-the-counter and prescription medicines only as told by your doctor.  If you have a sore throat, gargle with salt water. Do this 3-4 times per day or as needed. To make a salt-water mixture, dissolve -1 tsp of salt in 1 cup of warm water. Make sure that all the salt dissolves.  Use nose drops made from salt water. This helps with stuffiness (congestion). It also helps soften the skin around your nose.  Drink enough fluid to keep your pee (urine) pale yellow. General instructions   Rest as much as possible.  Do not drink alcohol.  Do not use any products that have nicotine or tobacco, such as cigarettes and e-cigarettes. If you need help quitting, ask your doctor.  Keep all follow-up visits as told by your doctor. This is important. How is this prevented?   Get a flu shot every year. Ask your doctor when you should get your flu shot.  Do not let other people get your germs. If you are sick: ? Stay home from work or school. ? Wash your hands with soap and water often. Wash your hands after you cough or sneeze. If soap and water are not available, use hand sanitizer.  Avoid contact with people who are sick during cold and flu season. This is in fall and winter. Get help if:  Your symptoms last for 10 days or  longer.  Your symptoms get worse over time.  You have a fever.  You have very bad pain in your face or forehead.  Parts of your jaw or neck become very swollen. Get help right away if:  You feel pain or pressure in your chest.  You have shortness of breath.  You faint or feel like you will faint.  You keep throwing up (vomiting).  You feel confused. Summary  A viral respiratory infection is an illness that affects parts of the body that are used for breathing.  Examples of this illness include a cold, the flu, and respiratory syncytial virus (RSV) infection.  The infection can cause a runny nose, cough, sneezing, sore throat, and fever.  Follow what your doctor tells you about taking medicines, drinking lots of fluid, washing your hands, resting at home, and avoiding people who are sick. This information is not intended to replace advice given to you by your health care provider. Make sure you discuss any questions you have with your health care provider. Document Revised: 03/14/2018 Document Reviewed: 04/16/2017 Elsevier Patient Education  2020 [employer]  Cough, Adult A cough helps to clear your throat and lungs. A cough may be a sign of an illness or another medical condition. An acute cough may only last 2-3 weeks, while a chronic cough may last 8 or more weeks. Many things can cause a cough. They include:  Germs (viruses or bacteria) that attack the airway.  Breathing in things that bother (irritate) your lungs.  Allergies.  Asthma.  Mucus that runs down the back of your throat (postnasal drip).  Smoking.  Acid backing up from the stomach into the tube that moves food from the mouth to the stomach (gastroesophageal reflux).  Some medicines.  Lung problems.  Other medical conditions, such as heart failure or a blood clot in the lung (pulmonary embolism). Follow these instructions at home: Medicines  Take over-the-counter and prescription medicines  only as told by your doctor.  Talk with your doctor before you take medicines that stop a cough (coughsuppressants). Lifestyle   Do not smoke, and try not to be around smoke. Do not use any products that contain nicotine or tobacco, such as cigarettes, e-cigarettes, and chewing tobacco. If you need help quitting, ask your doctor.  Drink enough fluid to keep your pee (urine) pale yellow.  Avoid caffeine.  Do not drink alcohol if your doctor tells you not to drink. General instructions   Watch for any changes in your cough. Tell your doctor about them.  Always cover your mouth when you cough.  Stay away from things that make you cough, such as perfume, candles, campfire smoke, or cleaning products.  If the air is dry, use a cool mist vaporizer or humidifier in your home.  If your cough is worse at night, try using extra pillows to raise your head up higher while you sleep.  Rest as needed.  Keep all follow-up visits as told by your doctor. This is important. Contact a doctor if:  You have new symptoms.  You cough up pus.  Your cough does not get better after 2-3 weeks, or your cough gets worse.  Cough medicine does not help your cough and you are not sleeping well.  You have pain that gets worse or pain that is not helped with medicine.  You have a fever.  You are losing weight and you do not know why.  You have night sweats. Get help right away if:  You cough up blood.  You have trouble breathing.  Your heartbeat is very fast. These symptoms may be an emergency. Do not wait to see if the symptoms will go away. Get medical help right away. Call your local emergency services (911 in the [place] Do not drive yourself to the hospital. Summary  A cough helps to clear your throat and lungs. Many things can cause a cough.  Take over-the-counter and prescription medicines only as told by your doctor.  Always cover your mouth when you cough.  Contact a doctor if you  have new symptoms or you have a cough that does not get better or gets worse. This information is not intended to replace advice given to you by your health care provider. Make sure you discuss any questions you have with your health care provider. Document Revised: 03/25/2018 Document Reviewed: 03/25/2018 Elsevier Patient Education  2020 [employer].

## 2020-02-27 LAB — INFLUENZA A AND B
Influenza A Ag, EIA: NEGATIVE
Influenza B Ag, EIA: NEGATIVE

## 2020-02-27 LAB — PLEASE NOTE:

## 2020-02-27 LAB — SARS-COV-2, NAA 2 DAY TAT

## 2020-02-27 LAB — NOVEL CORONAVIRUS, NAA: SARS-CoV-2, NAA: DETECTED — AB

## 2020-02-29 ENCOUNTER — Encounter: Payer: Self-pay | Admitting: Physician Assistant

## 2020-02-29 ENCOUNTER — Telehealth: Payer: Self-pay | Admitting: Physician Assistant

## 2020-02-29 DIAGNOSIS — E079 Disorder of thyroid, unspecified: Secondary | ICD-10-CM | POA: Insufficient documentation

## 2020-02-29 DIAGNOSIS — I82409 Acute embolism and thrombosis of unspecified deep veins of unspecified lower extremity: Secondary | ICD-10-CM | POA: Insufficient documentation

## 2020-02-29 NOTE — Telephone Encounter (Signed)
Called to discuss with patient about Covid symptoms and the use of sotrovimab, bamlanivimab/etesevimab or casirivimab/imdevimab, a monoclonal antibody infusion for those with mild to moderate Covid symptoms and at a high risk of hospitalization.  Pt is qualified for this infusion at the [facility] due to; Specific high risk criteria : BMI > 25   Message left to call back our hotline [phone]. My chart message sent if active on Mychart.   Argelio Granier PA-C  

## 2020-03-04 ENCOUNTER — Ambulatory Visit: Payer: BC Managed Care – PPO | Admitting: Family Medicine

## 2020-03-05 ENCOUNTER — Other Ambulatory Visit: Payer: BC Managed Care – PPO

## 2020-03-09 ENCOUNTER — Inpatient Hospital Stay: Payer: BC Managed Care – PPO | Attending: Hematology | Admitting: Hematology

## 2020-03-10 ENCOUNTER — Encounter: Payer: Self-pay | Admitting: Family Medicine

## 2020-03-10 ENCOUNTER — Other Ambulatory Visit: Payer: Self-pay

## 2020-03-10 ENCOUNTER — Telehealth (INDEPENDENT_AMBULATORY_CARE_PROVIDER_SITE_OTHER): Payer: BC Managed Care – PPO | Admitting: Family Medicine

## 2020-03-10 DIAGNOSIS — R059 Cough, unspecified: Secondary | ICD-10-CM | POA: Diagnosis not present

## 2020-03-10 NOTE — Progress Notes (Addendum)
Virtual Visit Note  I connected with patient on 03/10/20 at 1400 by telephone due to unable to work Epic video visit and verified that I am speaking with the correct person using two identifiers. Gabriella Thomas is currently located at home and no family members are currently with them during visit. The provider, Azalee Course Mayline Dragon, FNP is located in their office at time of visit.  I discussed the limitations, risks, security and privacy concerns of performing an evaluation and management service by telephone and the availability of in person appointments. I also discussed with the patient that there may be a patient responsible charge related to this service. The patient expressed understanding and agreed to proceed.   I provided 20 minutes of non-face-to-face time during this encounter.  Chief Complaint  Patient presents with  . Cough    X 2 weeks - tested positive covid 2 weeks ago has been taking her medications and has been quaranteened      HPI ? Previously tested positive for COVID 02/26/20 Still has a cough, no other symptoms Taking tessalon pearls for her cough Verbalizes loss of taste  No Known Allergies  Prior to Admission medications   Medication Sig Start Date End Date Taking? Authorizing Provider  benzonatate (TESSALON) 100 MG capsule Take 1-2 capsules (100-200 mg total) by mouth 3 (three) times daily as needed for cough. 02/26/20  Yes Alvia Tory, Azalee Course, FNP  Dextromethorphan-guaiFENesin (MUCINEX DM MAXIMUM STRENGTH) 60-1200 MG TB12 Take 1 tablet by mouth every 12 (twelve) hours. 02/26/20  Yes Faythe Heitzenrater, Azalee Course, FNP  rivaroxaban (XARELTO) 20 MG TABS tablet Take 1 tablet (20 mg total) by mouth daily with supper. 02/04/20  Yes Devontae Casasola, Azalee Course, FNP    Past Medical History:  Diagnosis Date  . BMI 30.0-30.9,adult   . DVT (deep venous thrombosis) (HCC)   . Thyroid disease     Past Surgical History:  Procedure Laterality Date  . CESAREAN SECTION  07/12/2005   06/12/2008,  11/15/2009    Social History   Tobacco Use  . Smoking status: Never Smoker  . Smokeless tobacco: Never Used  Substance Use Topics  . Alcohol use: Never    History reviewed. No pertinent family history.  Review of Systems  Constitutional: Negative for chills, fever, malaise/fatigue and weight loss.  HENT: Negative for congestion, ear discharge, ear pain, sinus pain and sore throat.   Respiratory: Positive for cough. Negative for hemoptysis, sputum production, shortness of breath and stridor.   Cardiovascular: Negative for chest pain and leg swelling.  Gastrointestinal: Negative for abdominal pain, constipation, diarrhea, heartburn, nausea and vomiting.    Objective  Constitutional:      General: She is not in acute distress.    Appearance: Normal appearance. She is not ill-appearing.   Pulmonary:     Effort: Pulmonary effort is normal. No respiratory distress.  Neurological:     Mental Status: She is alert and oriented to person, place, and time.  Psychiatric:        Mood and Affect: Mood normal.        Behavior: Behavior normal.     ASSESSMENT and PLAN  Problem List Items Addressed This Visit   None   Visit Diagnoses    Cough    -  Primary   Relevant Orders   Novel Coronavirus, NAA ([facility])  Discussed that she did not need to retest She would feel more comfortable with a negative test Discussed conservative treatment for cost RTC/ED precautions provided  Return if symptoms worsen or fail to improve.   The above assessment and management plan was discussed with the patient. The patient verbalized understanding of and has agreed to the management plan. Patient is aware to call the clinic if symptoms persist or worsen. Patient is aware when to return to the clinic for a follow-up visit. Patient educated on when it is appropriate to go to the emergency department.    Macario Carls Sherif Millspaugh, FNP-BC [facility] [address] [place], [place] [address] Ph.   [phone] Fax [phone]  I have reviewed and agree with above documentation. Edwina Barth, MD

## 2020-03-10 NOTE — Patient Instructions (Addendum)
COVID-19 COVID-19 is a respiratory infection that is caused by a virus called severe acute respiratory syndrome coronavirus 2 (SARS-CoV-2). The disease is also known as coronavirus disease or novel coronavirus. In some people, the virus may not cause any symptoms. In others, it may cause a serious infection. The infection can get worse quickly and can lead to complications, such as:  Pneumonia, or infection of the lungs.  Acute respiratory distress syndrome or ARDS. This is a condition in which fluid build-up in the lungs prevents the lungs from filling with air and passing oxygen into the blood.  Acute respiratory failure. This is a condition in which there is not enough oxygen passing from the lungs to the body or when carbon dioxide is not passing from the lungs out of the body.  Sepsis or septic shock. This is a serious bodily reaction to an infection.  Blood clotting problems.  Secondary infections due to bacteria or fungus.  Organ failure. This is when your body's organs stop working. The virus that causes COVID-19 is contagious. This means that it can spread from person to person through droplets from coughs and sneezes (respiratory secretions). What are the causes? This illness is caused by a virus. You may catch the virus by:  Breathing in droplets from an infected person. Droplets can be spread by a person breathing, speaking, singing, coughing, or sneezing.  Touching something, like a table or a doorknob, that was exposed to the virus (contaminated) and then touching your mouth, nose, or eyes. What increases the risk? Risk for infection You are more likely to be infected with this virus if you:  Are within 6 feet (2 meters) of a person with COVID-19.  Provide care for or live with a person who is infected with COVID-19.  Spend time in crowded indoor spaces or live in shared housing. Risk for serious illness You are more likely to become seriously ill from the virus if  you:  Are 50 years of age or older. The higher your age, the more you are at risk for serious illness.  Live in a nursing home or long-term care facility.  Have cancer.  Have a long-term (chronic) disease such as: ? Chronic lung disease, including chronic obstructive pulmonary disease or asthma. ? A long-term disease that lowers your body's ability to fight infection (immunocompromised). ? Heart disease, including heart failure, a condition in which the arteries that lead to the heart become narrow or blocked (coronary artery disease), a disease which makes the heart muscle thick, weak, or stiff (cardiomyopathy). ? Diabetes. ? Chronic kidney disease. ? Sickle cell disease, a condition in which red blood cells have an abnormal "sickle" shape. ? Liver disease.  Are obese. What are the signs or symptoms? Symptoms of this condition can range from mild to severe. Symptoms may appear any time from 2 to 14 days after being exposed to the virus. They include:  A fever or chills.  A cough.  Difficulty breathing.  Headaches, body aches, or muscle aches.  Runny or stuffy (congested) nose.  A sore throat.  New loss of taste or smell. Some people may also have stomach problems, such as nausea, vomiting, or diarrhea. Other people may not have any symptoms of COVID-19. How is this diagnosed? This condition may be diagnosed based on:  Your signs and symptoms, especially if: ? You live in an area with a COVID-19 outbreak. ? You recently traveled to or from an area where the virus is common. ? You   provide care for or live with a person who was diagnosed with COVID-19. ? You were exposed to a person who was diagnosed with COVID-19.  A physical exam.  Lab tests, which may include: ? Taking a sample of fluid from the back of your nose and throat (nasopharyngeal fluid), your nose, or your throat using a swab. ? A sample of mucus from your lungs (sputum). ? Blood tests.  Imaging tests,  which may include, X-rays, CT scan, or ultrasound. How is this treated? At present, there is no medicine to treat COVID-19. Medicines that treat other diseases are being used on a trial basis to see if they are effective against COVID-19. Your health care provider will talk with you about ways to treat your symptoms. For most people, the infection is mild and can be managed at home with rest, fluids, and over-the-counter medicines. Treatment for a serious infection usually takes places in a hospital intensive care unit (ICU). It may include one or more of the following treatments. These treatments are given until your symptoms improve.  Receiving fluids and medicines through an IV.  Supplemental oxygen. Extra oxygen is given through a tube in the nose, a face mask, or a hood.  Positioning you to lie on your stomach (prone position). This makes it easier for oxygen to get into the lungs.  Continuous positive airway pressure (CPAP) or bi-level positive airway pressure (BPAP) machine. This treatment uses mild air pressure to keep the airways open. A tube that is connected to a motor delivers oxygen to the body.  Ventilator. This treatment moves air into and out of the lungs by using a tube that is placed in your windpipe.  Tracheostomy. This is a procedure to create a hole in the neck so that a breathing tube can be inserted.  Extracorporeal membrane oxygenation (ECMO). This procedure gives the lungs a chance to recover by taking over the functions of the heart and lungs. It supplies oxygen to the body and removes carbon dioxide. Follow these instructions at home: Lifestyle  If you are sick, stay home except to get medical care. Your health care provider will tell you how long to stay home. Call your health care provider before you go for medical care.  Rest at home as told by your health care provider.  Do not use any products that contain nicotine or tobacco, such as cigarettes,  e-cigarettes, and chewing tobacco. If you need help quitting, ask your health care provider.  Return to your normal activities as told by your health care provider. Ask your health care provider what activities are safe for you. General instructions  Take over-the-counter and prescription medicines only as told by your health care provider.  Drink enough fluid to keep your urine pale yellow.  Keep all follow-up visits as told by your health care provider. This is important. How is this prevented?  There is no vaccine to help prevent COVID-19 infection. However, there are steps you can take to protect yourself and others from this virus. To protect yourself:   Do not travel to areas where COVID-19 is a risk. The areas where COVID-19 is reported change often. To identify high-risk areas and travel restrictions, check the CDC travel website: wwwnc.cdc.gov/travel/notices  If you live in, or must travel to, an area where COVID-19 is a risk, take precautions to avoid infection. ? Stay away from people who are sick. ? Wash your hands often with soap and water for 20 seconds. If soap and water   are not available, use an alcohol-based hand sanitizer. ? Avoid touching your mouth, face, eyes, or nose. ? Avoid going out in public, follow guidance from your state and local health authorities. ? If you must go out in public, wear a cloth face covering or face mask. Make sure your mask covers your nose and mouth. ? Avoid crowded indoor spaces. Stay at least 6 feet (2 meters) away from others. ? Disinfect objects and surfaces that are frequently touched every day. This may include:  Counters and tables.  Doorknobs and light switches.  Sinks and faucets.  Electronics, such as phones, remote controls, keyboards, computers, and tablets. To protect others: If you have symptoms of COVID-19, take steps to prevent the virus from spreading to others.  If you think you have a COVID-19 infection, contact  your health care provider right away. Tell your health care team that you think you may have a COVID-19 infection.  Stay home. Leave your house only to seek medical care. Do not use public transport.  Do not travel while you are sick.  Wash your hands often with soap and water for 20 seconds. If soap and water are not available, use alcohol-based hand sanitizer.  Stay away from other members of your household. Let healthy household members care for children and pets, if possible. If you have to care for children or pets, wash your hands often and wear a mask. If possible, stay in your own room, separate from others. Use a different bathroom.  Make sure that all people in your household wash their hands well and often.  Cough or sneeze into a tissue or your sleeve or elbow. Do not cough or sneeze into your hand or into the air.  Wear a cloth face covering or face mask. Make sure your mask covers your nose and mouth. Where to find more information  Centers for Disease Control and Prevention: www.cdc.gov/coronavirus/2019-ncov/index.html  World Health Organization: www.who.int/health-topics/coronavirus Contact a health care provider if:  You live in or have traveled to an area where COVID-19 is a risk and you have symptoms of the infection.  You have had contact with someone who has COVID-19 and you have symptoms of the infection. Get help right away if:  You have trouble breathing.  You have pain or pressure in your chest.  You have confusion.  You have bluish lips and fingernails.  You have difficulty waking from sleep.  You have symptoms that get worse. These symptoms may represent a serious problem that is an emergency. Do not wait to see if the symptoms will go away. Get medical help right away. Call your local emergency services (911 in the [place] Do not drive yourself to the hospital. Let the emergency medical personnel know if you think you have  COVID-19. Summary  COVID-19 is a respiratory infection that is caused by a virus. It is also known as coronavirus disease or novel coronavirus. It can cause serious infections, such as pneumonia, acute respiratory distress syndrome, acute respiratory failure, or sepsis.  The virus that causes COVID-19 is contagious. This means that it can spread from person to person through droplets from breathing, speaking, singing, coughing, or sneezing.  You are more likely to develop a serious illness if you are 50 years of age or older, have a weak immune system, live in a nursing home, or have chronic disease.  There is no medicine to treat COVID-19. Your health care provider will talk with you about ways to treat your symptoms.    Take steps to protect yourself and others from infection. Wash your hands often and disinfect objects and surfaces that are frequently touched every day. Stay away from people who are sick and wear a mask if you are sick. This information is not intended to replace advice given to you by your health care provider. Make sure you discuss any questions you have with your health care provider. Document Revised: 01/03/2019 Document Reviewed: 04/11/2018 Elsevier Patient Education  2020 [employer].    COVID-19 Frequently Asked Questions COVID-19 (coronavirus disease) is an infection that is caused by a large family of viruses. Some viruses cause illness in people and others cause illness in animals like camels, cats, and bats. In some cases, the viruses that cause illness in animals can spread to humans. Where did the coronavirus come from? In December 2019, [place] told the [employer] [facility]) of several cases of lung disease (human respiratory illness). These cases were linked to an open seafood and livestock market in the city of [place]. The link to the seafood and livestock market suggests that the virus may have spread from animals to humans. However, since that first  outbreak in December, the virus has also been shown to spread from person to person. What is the name of the disease and the virus? Disease name Early on, this disease was called novel coronavirus. This is because scientists determined that the disease was caused by a new (novel) respiratory virus. The World Health Organization [facility]) has now named the disease COVID-19, or coronavirus disease. Virus name The virus that causes the disease is called severe acute respiratory syndrome coronavirus 2 (SARS-CoV-2). More information on disease and virus naming World Health Organization [facility]): www.who.int/emergencies/diseases/novel-coronavirus-2019/technical-guidance/naming-the-coronavirus-disease-(covid-2019)-and-the-virus-that-causes-it Who is at risk for complications from coronavirus disease? Some people may be at higher risk for complications from coronavirus disease. This includes older adults and people who have chronic diseases, such as heart disease, diabetes, and lung disease. If you are at higher risk for complications, take these extra precautions:  Stay home as much as possible.  Avoid social gatherings and travel.  Avoid close contact with others. Stay at least 6 ft (2 m) away from others, if possible.  Wash your hands often with soap and water for at least 20 seconds.  Avoid touching your face, mouth, nose, or eyes.  Keep supplies on hand at home, such as food, medicine, and cleaning supplies.  If you must go out in public, wear a cloth face covering or face mask. Make sure your mask covers your nose and mouth. How does coronavirus disease spread? The virus that causes coronavirus disease spreads easily from person to person (is contagious). You may catch the virus by:  Breathing in droplets from an infected person. Droplets can be spread by a person breathing, speaking, singing, coughing, or sneezing.  Touching something, like a table or a doorknob, that was exposed to the virus  (contaminated) and then touching your mouth, nose, or eyes. Can I get the virus from touching surfaces or objects? There is still a lot that we do not know about the virus that causes coronavirus disease. Scientists are basing a lot of information on what they know about similar viruses, such as:  Viruses cannot generally survive on surfaces for long. They need a human body (host) to survive.  It is more likely that the virus is spread by close contact with people who are sick (direct contact), such as through: ? Shaking hands or hugging. ? Breathing in respiratory  droplets that travel through the air. Droplets can be spread by a person breathing, speaking, singing, coughing, or sneezing.  It is less likely that the virus is spread when a person touches a surface or object that has the virus on it (indirect contact). The virus may be able to enter the body if the person touches a surface or object and then touches his or her face, eyes, nose, or mouth. Can a person spread the virus without having symptoms of the disease? It may be possible for the virus to spread before a person has symptoms of the disease, but this is most likely not the main way the virus is spreading. It is more likely for the virus to spread by being in close contact with people who are sick and breathing in the respiratory droplets spread by a person breathing, speaking, singing, coughing, or sneezing. What are the symptoms of coronavirus disease? Symptoms vary from person to person and can range from mild to severe. Symptoms may include:  Fever or chills.  Cough.  Difficulty breathing or feeling short of breath.  Headaches, body aches, or muscle aches.  Runny or stuffy (congested) nose.  Sore throat.  New loss of taste or smell.  Nausea, vomiting, or diarrhea. These symptoms can appear anywhere from 2 to 14 days after you have been exposed to the virus. Some people may not have any symptoms. If you develop  symptoms, call your health care provider. People with severe symptoms may need hospital care. Should I be tested for this virus? Your health care provider will decide whether to test you based on your symptoms, history of exposure, and your risk factors. How does a health care provider test for this virus? Health care providers will collect samples to send for testing. Samples may include:  Taking a swab of fluid from the back of your nose and throat, your nose, or your throat.  Taking fluid from the lungs by having you cough up mucus (sputum) into a sterile cup.  Taking a blood sample. Is there a treatment or vaccine for this virus? Currently, there is no vaccine to prevent coronavirus disease. Also, there are no medicines like antibiotics or antivirals to treat the virus. A person who becomes sick is given supportive care, which means rest and fluids. A person may also relieve his or her symptoms by using over-the-counter medicines that treat sneezing, coughing, and runny nose. These are the same medicines that a person takes for the common cold. If you develop symptoms, call your health care provider. People with severe symptoms may need hospital care. What can I do to protect myself and my family from this virus?     You can protect yourself and your family by taking the same actions that you would take to prevent the spread of other viruses. Take the following actions:  Wash your hands often with soap and water for at least 20 seconds. If soap and water are not available, use alcohol-based hand sanitizer.  Avoid touching your face, mouth, nose, or eyes.  Cough or sneeze into a tissue, sleeve, or elbow. Do not cough or sneeze into your hand or the air. ? If you cough or sneeze into a tissue, throw it away immediately and wash your hands.  Disinfect objects and surfaces that you frequently touch every day.  Stay away from people who are sick.  Avoid going out in public, follow  guidance from your state and local health authorities.  Avoid crowded indoor  spaces. Stay at least 6 ft (2 m) away from others.  If you must go out in public, wear a cloth face covering or face mask. Make sure your mask covers your nose and mouth.  Stay home if you are sick, except to get medical care. Call your health care provider before you get medical care. Your health care provider will tell you how long to stay home.  Make sure your vaccines are up to date. Ask your health care provider what vaccines you need. What should I do if I need to travel? Follow travel recommendations from your local health authority, the CDC, and WHO. Travel information and advice  Centers for Disease Control and Prevention (CDC): GeminiCard.gl  World Health Organization [facility]): PreviewDomains.se Know the risks and take action to protect your health  You are at higher risk of getting coronavirus disease if you are traveling to areas with an outbreak or if you are exposed to travelers from areas with an outbreak.  Wash your hands often and practice good hygiene to lower the risk of catching or spreading the virus. What should I do if I am sick? General instructions to stop the spread of infection  Wash your hands often with soap and water for at least 20 seconds. If soap and water are not available, use alcohol-based hand sanitizer.  Cough or sneeze into a tissue, sleeve, or elbow. Do not cough or sneeze into your hand or the air.  If you cough or sneeze into a tissue, throw it away immediately and wash your hands.  Stay home unless you must get medical care. Call your health care provider or local health authority before you get medical care.  Avoid public areas. Do not take public transportation, if possible.  If you can, wear a mask if you must go out of the house or if you are in close contact with someone  who is not sick. Make sure your mask covers your nose and mouth. Keep your home clean  Disinfect objects and surfaces that are frequently touched every day. This may include: ? Counters and tables. ? Doorknobs and light switches. ? Sinks and faucets. ? Electronics such as phones, remote controls, keyboards, computers, and tablets.  Wash dishes in hot, soapy water or use a dishwasher. Air-dry your dishes.  Wash laundry in hot water. Prevent infecting other household members  Let healthy household members care for children and pets, if possible. If you have to care for children or pets, wash your hands often and wear a mask.  Sleep in a different bedroom or bed, if possible.  Do not share personal items, such as razors, toothbrushes, deodorant, combs, brushes, towels, and washcloths. Where to find more information Centers for Disease Control and Prevention (CDC)  Information and news updates: CardRetirement.cz World Health Organization [facility])  Information and news updates: AffordableSalon.es  Coronavirus health topic: https://thompson-craig.com/  Questions and answers on COVID-19: kruiseway.com  Global tracker: who.sprinklr.com American Academy of Pediatrics (AAP)  Information for families: www.healthychildren.org/English/health-issues/conditions/chest-lungs/Pages/2019-Novel-Coronavirus.aspx The coronavirus situation is changing rapidly. Check your local health authority website or the CDC and [facility] websites for updates and news. When should I contact a health care provider?  Contact your health care provider if you have symptoms of an infection, such as fever or cough, and you: ? Have been near anyone who is known to have coronavirus disease. ? Have come into contact with a person who is suspected to have coronavirus disease. ? Have traveled to an area where there  is an outbreak of  COVID-19. When should I get emergency medical care?  Get help right away by calling your local emergency services (911 in the [place] if you have: ? Trouble breathing. ? Pain or pressure in your chest. ? Confusion. ? Blue-tinged lips and fingernails. ? Difficulty waking from sleep. ? Symptoms that get worse. Let the emergency medical personnel know if you think you have coronavirus disease. Summary  A new respiratory virus is spreading from person to person and causing COVID-19 (coronavirus disease).  The virus that causes COVID-19 appears to spread easily. It spreads from one person to another through droplets from breathing, speaking, singing, coughing, or sneezing.  Older adults and those with chronic diseases are at higher risk of disease. If you are at higher risk for complications, take extra precautions.  There is currently no vaccine to prevent coronavirus disease. There are no medicines, such as antibiotics or antivirals, to treat the virus.  You can protect yourself and your family by washing your hands often, avoiding touching your face, and covering your coughs and sneezes. This information is not intended to replace advice given to you by your health care provider. Make sure you discuss any questions you have with your health care provider. Document Revised: 01/03/2019 Document Reviewed: 07/02/2018 Elsevier Patient Education  [employer].   If you have lab work done today you will be contacted with your lab results within the next 2 weeks.  If you have not heard from [place] then please contact us. The fastest way to get your results is to register for My Chart.   IF you received an x-ray today, you will receive an invoice from [facility]. Please contact [facility] at [phone] with questions or concerns regarding your invoice.   IF you received labwork today, you will receive an invoice from [place]. Please contact [facility] at [phone] with  questions or concerns regarding your invoice.   Our billing staff will not be able to assist you with questions regarding bills from these companies.  You will be contacted with the lab results as soon as they are available. The fastest way to get your results is to activate your My Chart account. Instructions are located on the last page of this paperwork. If you have not heard from [place] regarding the results in 2 weeks, please contact this office.

## 2020-03-17 MED FILL — XARELTO 20 MG TABLET: 20 | 30 days supply | Qty: 30 | Fill #1

## 2020-04-15 MED FILL — XARELTO 20 MG TABLET: 20 | 30 days supply | Qty: 30 | Fill #2

## 2020-04-19 ENCOUNTER — Other Ambulatory Visit: Payer: Self-pay

## 2020-04-19 ENCOUNTER — Encounter: Payer: Self-pay | Admitting: Family Medicine

## 2020-04-19 ENCOUNTER — Ambulatory Visit (INDEPENDENT_AMBULATORY_CARE_PROVIDER_SITE_OTHER): Payer: BC Managed Care – PPO | Admitting: Family Medicine

## 2020-04-19 VITALS — BP 131/94 | HR 78 | Temp 97.2°F | Ht 65.0 in | Wt 187.0 lb

## 2020-04-19 DIAGNOSIS — I824Y1 Acute embolism and thrombosis of unspecified deep veins of right proximal lower extremity: Secondary | ICD-10-CM

## 2020-04-19 DIAGNOSIS — E079 Disorder of thyroid, unspecified: Secondary | ICD-10-CM

## 2020-04-19 DIAGNOSIS — Z1321 Encounter for screening for nutritional disorder: Secondary | ICD-10-CM

## 2020-04-19 DIAGNOSIS — I824Z9 Acute embolism and thrombosis of unspecified deep veins of unspecified distal lower extremity: Secondary | ICD-10-CM

## 2020-04-19 DIAGNOSIS — L659 Nonscarring hair loss, unspecified: Secondary | ICD-10-CM

## 2020-04-19 DIAGNOSIS — Z13220 Encounter for screening for lipoid disorders: Secondary | ICD-10-CM

## 2020-04-19 DIAGNOSIS — Z131 Encounter for screening for diabetes mellitus: Secondary | ICD-10-CM

## 2020-04-19 DIAGNOSIS — Z1231 Encounter for screening mammogram for malignant neoplasm of breast: Secondary | ICD-10-CM

## 2020-04-19 DIAGNOSIS — Z114 Encounter for screening for human immunodeficiency virus [HIV]: Secondary | ICD-10-CM | POA: Diagnosis not present

## 2020-04-19 DIAGNOSIS — Z1159 Encounter for screening for other viral diseases: Secondary | ICD-10-CM | POA: Diagnosis not present

## 2020-04-19 DIAGNOSIS — Z1211 Encounter for screening for malignant neoplasm of colon: Secondary | ICD-10-CM

## 2020-04-19 MED ORDER — TRIAMCINOLONE ACETONIDE 0.1 % EX CREA: 1.0000 "application " | TOPICAL_CREAM | Freq: Two times a day (BID) | CUTANEOUS | 1 refills | Status: AC

## 2020-04-19 MED ORDER — RIVAROXABAN 20 MG PO TABS: 20.0000 mg | ORAL_TABLET | Freq: Every day | ORAL | 2 refills | Status: DC

## 2020-04-19 NOTE — Patient Instructions (Addendum)
Alopecia Areata, Adult  Alopecia areata is a condition that causes hair loss. A person with this condition may lose hair on the scalp in patches. In some cases, a person may lose all the hair on the scalp or all the hair from the face and body. Having this condition can be emotionally difficult, but it is not dangerous. Alopecia areata is an autoimmune disease. This means that your body's defense system (immune system) mistakes normal parts of the body for germs or other things that can make you sick. When you have alopecia areata, the immune system attacks the hair follicles. What are the causes? The cause of this condition is not known. What increases the risk? You are more likely to develop this condition if you have:  A family history of alopecia.  A family history of another autoimmune disease, including type 1 diabetes and thyroid autoimmune disease.  Eczema, asthma, and allergies.  Down syndrome. What are the signs or symptoms? The main symptom of this condition is round spots of patchy hair loss on the scalp. The spots may be mildly itchy. Other symptoms include:  Short dark hairs in the bald patches that are wider at the top (exclamation point hairs).  Dents, white spots, or lines in the fingernails or toenails.  Balding and body hair loss. This is rare. Alopecia areata usually develops in childhood, but it can develop at any age. For some people, their hair grows back on its own and hair loss does not happen again. For others, their hair may fall out and grow back in cycles. The hair loss may last many years. How is this diagnosed? This condition is diagnosed based on your symptoms and family history. Your health care provider will also check your scalp skin, teeth, and nails. Your health care provider may refer you to a specialist in hair and skin disorders (dermatologist). You may also have tests, including:  A hair pull test.  Blood tests or other screening tests to  check for autoimmune diseases, such as thyroid disease or diabetes.  Skin biopsy to confirm the diagnosis.  A procedure to examine the skin with a lighted magnifying instrument (dermoscopy). How is this treated? There is no cure for alopecia areata. The goals of treatment are to promote the regrowth of hair and prevent the immune system from overreacting. No single treatment is right for all people with alopecia areata. It depends on the type of hair loss you have and how severe it is. Work with your health care provider to find the best treatment for you. Treatment may include:  Regular checkups to make sure the condition is not getting worse . This is called watchful waiting.  Using steroid creams or pills for 6-8 weeks to stop the immune reaction and help hair to regrow more quickly.  Using other medicines on your skin (topical medicines) to change the immune system response and support the hair growth cycle.  Steroid injections.  Therapy and counseling with a support group or therapist if you are having trouble coping with hair loss. Follow these instructions at home: Medicines  Apply topical creams only as told by your health care provider.  Take over-the-counter and prescription medicines only as told by your health care provider. General instructions  Learn as much as you can about your condition.  Consider getting a wig or products to make hair look fuller or to cover bald spots, if you feel uncomfortable with your appearance.  Get therapy or counseling if you  are having a hard time coping with hair loss. Ask your health care provider to recommend a counselor or support group.  Keep all follow-up visits as told by your health care provider. This is important. Where to find more information  National Alopecia Areata Foundation: naaf.org Contact a health care provider if:  Your hair loss gets worse, even with treatment.  You have new symptoms.  You are struggling  emotionally. Get help right away if:  You have a sudden worsening of the hair loss. Summary  Alopecia areata is an autoimmune condition that makes your body's defense system (immune system) attack the hair follicles. This causes you to lose hair.  Having this condition can be emotionally difficult, but it is not dangerous.  Treatments may include regular checkups to make sure that the condition is not getting worse, medicines, and steroid injections. This information is not intended to replace advice given to you by your health care provider. Make sure you discuss any questions you have with your health care provider. Document Revised: 05/20/2019 Document Reviewed: 05/20/2019 Elsevier Patient Education  2021 [employer].   https://www.cancer.org/cancer/colon-rectal-cancer/causes-risks-prevention/risk-factors.html">  Colorectal Cancer Screening  Colorectal cancer screening is a group of tests that are used to check for colorectal cancer before symptoms develop. Colorectal refers to the colon and rectum. The colon and rectum are located at the end of the digestive tract and carry stool (feces) out of the body. Who should have screening? All adults who are 68-64 years old should have screening. Your health care provider may recommend screening before age 27. You will have tests every 1-10 years, depending on your results and the type of screening test. Screening recommendations for adults who are 38-69 years old vary depending on a person's health. People older than age 73 should no longer get colorectal cancer screening. You may have screening tests starting before age 67, or more often than other people, if you have any of these risk factors:  A personal or family history of colorectal cancer or abnormal growths known as polyps in your colon.  Inflammatory bowel disease, such as ulcerative colitis or Crohn's disease.  A history of having radiation treatment to the abdomen or the area  between the hip bones (pelvic area) for cancer.  A type of genetic syndrome that is passed from parent to child (hereditary), such as: ? Lynch syndrome. ? Familial adenomatous polyposis. ? Turcot syndrome. ? Peutz-Jeghers syndrome. ? MUTYH-associated polyposis (MAP).  A personal history of diabetes. Types of tests There are several types of colorectal screening tests. You may have one or more of the following:  Guaiac-based fecal occult blood testing. For this test, a stool sample is checked for hidden (occult) blood, which could be a sign of colorectal cancer.  Fecal immunochemical test (FIT). For this test, a stool sample is checked for blood, which could be a sign of colorectal cancer.  Stool DNA test. For this test, a stool sample is checked for blood and changes in DNA that could lead to colorectal cancer.  Sigmoidoscopy. During this test, a thin, flexible tube with a camera on the end, called a sigmoidoscope, is used to examine the rectum and the lower colon.  Colonoscopy. During this test, a long, flexible tube with a camera on the end, called a colonoscope, is used to examine the entire colon and rectum. Also, sometimes a tissue sample is taken to be looked at under a microscope (biopsy) or small polyps are removed during this test.  Virtual colonoscopy. Instead  of a colonoscope, this type of colonoscopy uses a CT scan to take pictures of the colon and rectum. A CT scan is a type of X-ray that is made using computers. What are the benefits of screening? Screening reduces your risk for colorectal cancer and can help identify cancer at an early stage, when the cancer can be removed or treated more easily. It is common for polyps to form in the lining of the colon, especially as you age. These polyps may be cancerous or become cancerous over time. Screening can identify these polyps. What are the risks of screening? Generally, these are safe tests. However, problems may occur,  including:  The need for more tests to confirm results from a stool sample test. Stool sample tests have fewer risks than other types of screening tests.  Being exposed to low levels of radiation, if you had a test involving X-rays. This may slightly increase your cancer risk. The benefit of detecting cancer outweighs the slight increase in risk.  Bleeding, damage to the intestine, or infection caused by a sigmoidoscopy or colonoscopy.  A reaction to medicines given during a sigmoidoscopy or colonoscopy. Talk with your health care provider to understand your risk for colorectal cancer and to make a screening plan that is right for you. Questions to ask your health care provider  When should I start colorectal cancer screening?  What is my risk for colorectal cancer?  How often do I need screening?  Which screening tests do I need?  How do I get my test results?  What do my results mean? Where to find more information Learn more about colorectal cancer screening from:  The American Cancer Society: cancer.org  National Cancer Institute: cancer.gov Summary  Colorectal cancer screening is a group of tests used to check for colorectal cancer before symptoms develop.  All adults who are 67-48 years old should have screening. Your health care provider may recommend screening before age 55.  You may have screening tests starting before age 52, or more often than other people, if you have certain risk factors.  Screening reduces your risk for colorectal cancer and can help identify cancer at an early stage, when the cancer can be removed or treated more easily.  Talk with your health care provider to understand your risk for colorectal cancer and to make a screening plan that is right for you. This information is not intended to replace advice given to you by your health care provider. Make sure you discuss any questions you have with your health care provider. Document Revised:  06/25/2019 Document Reviewed: 06/25/2019 Elsevier Patient Education  2021 [employer].   If you have lab work done today you will be contacted with your lab results within the next 2 weeks.  If you have not heard from [place] then please contact us. The fastest way to get your results is to register for My Chart.   IF you received an x-ray today, you will receive an invoice from [facility]. Please contact [facility] at [phone] with questions or concerns regarding your invoice.   IF you received labwork today, you will receive an invoice from [facility]. Please contact [facility] at [phone] with questions or concerns regarding your invoice.   Our billing staff will not be able to assist you with questions regarding bills from these companies.  You will be contacted with the lab results as soon as they are available. The fastest way to get your results is to activate your My  Chart account. Instructions are located on the last page of this paperwork. If you have not heard from [place] regarding the results in 2 weeks, please contact this office.

## 2020-04-19 NOTE — Progress Notes (Signed)
1/31/202210:50 AM  Gabriella Thomas 05-07-1975, 45 y.o., female [id number]  Chief Complaint  Patient presents with  . Transitions Of Care  . vascular US Lower extremity results 12/21    HPI:   Patient is a 45 y.o. female with past medical history significant for DVT and thyroid disease who presents today for toc.  Noticed she has thinning hair and a bald spot Had been using castor oil  LMP: monthly, no pain or heaviness Unknown family hx of menopause  Hyperthyroid Last OV placed referral to endocrinology Appt Dr. Loanne Drilling 2/8 [place] of thyroid placed in May, this was never done Was previously on medication for this  Saw a specialist for this.  Endocrinology:Dr Mechele Claude _____ [phone] Per last pharmacy: methimazole 35m BID Last took medication over 6 months ago Denies any h/o thyroid surgeries or nodules Last appointment 2 years ago Lab Results  Component Value Date   TSH 1.910 02/04/2020      DVT Last OV place referral to hematology Dr. KIrene Limbo12/21 (no show) Xarelto started by ED Diagnosed in August by ED Need Ultrasound of legs q 3 months until blood clot resolves Has been having headaches Takes Tylenol for this  Ultrasound: 12/3: Summary:  RIGHT:  - Findings suggest resolution of previously noted thrombus.  - There is no evidence of deep vein thrombosis in the lower extremity.  - No cystic structure found in the popliteal fossa.    Needs  Pap:Will do in office next visit Colon cancer screen: ordered cologuard Mammogram: ordered No cancer HX in family  Health Maintenance  Topic Date Due  . COVID-19 Vaccine (1) Never done  . MAMMOGRAM  Never done  . PAP SMEAR-Modifier  Never done  . COLONOSCOPY (Pts 45-470yrInsurance coverage will need to be confirmed)  Never done  . INFLUENZA VACCINE  06/17/2020 (Originally 10/19/2019)  . TETANUS/TDAP  02/03/2021 (Originally 01/09/1993)  . Hepatitis C Screening  Completed  . HIV Screening  Completed      [facility] 04/19/2020 03/10/2020 02/26/2020  Decreased Interest 0 0 0  Down, Depressed, Hopeless 0 0 0  PHQ - 2 Score 0 0 0    Fall Risk  04/19/2020 03/10/2020 02/26/2020 02/04/2020 12/18/2019  Falls in the past year? 0 0 0 0 0  Number falls in past yr: 0 0 0 0 0  Injury with Fall? 0 0 0 0 -  Follow up Falls evaluation completed Falls evaluation completed Falls evaluation completed Falls evaluation completed -     No Known Allergies  Prior to Admission medications   Medication Sig Start Date End Date Taking? Authorizing Provider  benzonatate (TESSALON) 100 MG capsule Take 1-2 capsules (100-200 mg total) by mouth 3 (three) times daily as needed for cough. 02/26/20   Jamarien Rodkey, KeLaurita QuintFNP  Dextromethorphan-guaiFENesin (MUCINEX DM MAXIMUM STRENGTH) 60-1200 MG TB12 Take 1 tablet by mouth every 12 (twelve) hours. 02/26/20   Gloyd Happ, KeLaurita QuintFNP  rivaroxaban (XARELTO) 20 MG TABS tablet Take 1 tablet (20 mg total) by mouth daily with supper. 02/04/20   Tamorah Hada, KeLaurita QuintFNP    Past Medical History:  Diagnosis Date  . BMI 30.0-30.9,adult   . [place]  . Thyroid disease     Past Surgical History:  Procedure Laterality Date  . CESAREAN SECTION  07/12/2005   06/12/2008, 11/15/2009  . TUBAL LIGATION      Social History   Tobacco Use  . Smoking status: Never Smoker  .  Smokeless tobacco: Never Used  Substance Use Topics  . Alcohol use: Never    History reviewed. No pertinent family history.  Review of Systems  Constitutional: Negative for chills, fever and malaise/fatigue.  Eyes: Negative for blurred vision and double vision.  Respiratory: Positive for cough (post covid). Negative for shortness of breath and wheezing.   Cardiovascular: Negative for chest pain, palpitations and leg swelling.  Gastrointestinal: Negative for abdominal pain, blood in stool, constipation, diarrhea, heartburn, nausea and vomiting.  Genitourinary: Negative for  dysuria, frequency and hematuria.  Musculoskeletal: Negative for back pain and joint pain.  Skin: Negative for itching and rash.       Scalp hair loss  Neurological: Negative for dizziness, weakness and headaches.     OBJECTIVE:  Today's Vitals   04/19/20 1016  BP: (!) 131/94  Pulse: 78  Temp: (!) 97.2 F (36.2 C)  SpO2: 100%  Weight: 187 lb (84.8 kg)  Height: 5' 5"  (1.651 m)   Body mass index is 31.12 kg/m.   Physical Exam Constitutional:      General: She is not in acute distress.    Appearance: Normal appearance. She is not ill-appearing.  HENT:     Head: Normocephalic. Hair is abnormal (large patch of thinning hair to top of scalp, assymetrical borders, no errythema).  Neck:     Thyroid: No thyroid mass, thyromegaly or thyroid tenderness.  Cardiovascular:     Rate and Rhythm: Normal rate and regular rhythm.     Pulses: Normal pulses.     Heart sounds: Normal heart sounds. No murmur heard. No friction rub. No gallop.   Pulmonary:     Effort: Pulmonary effort is normal. No respiratory distress.     Breath sounds: Normal breath sounds. No stridor. No wheezing, rhonchi or rales.  Abdominal:     General: Bowel sounds are normal.     Palpations: Abdomen is soft.     Tenderness: There is no abdominal tenderness.  Musculoskeletal:     Right lower leg: No edema.     Left lower leg: No edema.  Skin:    General: Skin is warm and dry.  Neurological:     Mental Status: She is alert and oriented to person, place, and time.  Psychiatric:        Mood and Affect: Mood normal.        Behavior: Behavior normal.     No results found for this or any previous visit (from the past 24 hour(s)).  No results found.   ASSESSMENT and PLAN  Problem List Items Addressed This Visit      Cardiovascular and Mediastinum   Deep vein thrombosis (DVT) of distal vein of lower extremity (HCC)   Relevant Medications   rivaroxaban (XARELTO) 20 MG TABS tablet   DVT (deep venous  thrombosis) (HCC)   Relevant Medications   rivaroxaban (XARELTO) 20 MG TABS tablet   Other Relevant Orders   CBC   Ambulatory referral to Hematology     Endocrine   Thyroid disease   Relevant Orders   Thyroid Profile    Other Visit Diagnoses    Encounter for hepatitis C screening test for low risk patient    -  Primary   Relevant Orders   Hepatitis C antibody   Screening for HIV (human immunodeficiency virus)       Relevant Orders   HIV Antibody (routine testing w rflx)   Screening for diabetes mellitus       Relevant Orders  CMP14+EGFR   Hemoglobin A1c   Encounter for vitamin deficiency screening       Relevant Orders   Vitamin D, 25-hydroxy   Screening, lipid       Relevant Orders   Lipid Panel   Encounter for screening mammogram for malignant neoplasm of breast       Relevant Orders   MS DIGITAL SCREENING TOMO BILATERAL   Special screening for malignant neoplasms, colon       Relevant Orders   Cologuard   Hair loss       Relevant Medications   triamcinolone (KENALOG) 0.1 %   Other Relevant Orders   Ambulatory referral to Dermatology      Plan . Referral placed to hematology: to evaluate to stop xarelto . Referral placed to Endocrinology due to hx thyroid disease . Will follow up with lab results . Order placed for cologuard, and mammogram . Will do pap next visit . For hair loss referral placed to dermatology, started steroid cream   Return in about 3 weeks (around 05/10/2020) for PaP.    Huston Foley Zayli Villafuerte, FNP-BC [facility] [place],  [id number] Ph.  [phone] Fax [phone]

## 2020-04-20 ENCOUNTER — Telehealth: Payer: Self-pay

## 2020-04-20 ENCOUNTER — Telehealth: Payer: Self-pay | Admitting: Family Medicine

## 2020-04-20 LAB — CMP14+EGFR
ALT: 10 IU/L (ref 0–32)
AST: 12 IU/L (ref 0–40)
Albumin/Globulin Ratio: 1.3 (ref 1.2–2.2)
Albumin: 4 g/dL (ref 3.8–4.8)
Alkaline Phosphatase: 65 IU/L (ref 44–121)
BUN/Creatinine Ratio: 15 (ref 9–23)
BUN: 9 mg/dL (ref 6–24)
Bilirubin Total: <0.2 mg/dL (ref 0.0–1.2)
CO2: 22 mmol/L (ref 20–29)
Calcium: 8.9 mg/dL (ref 8.7–10.2)
Chloride: 105 mmol/L (ref 96–106)
Creatinine, Ser: 0.59 mg/dL (ref 0.57–1.00)
GFR calc Af Amer: 127 mL/min/{1.73_m2} (ref >59)
GFR calc non Af Amer: 110 mL/min/{1.73_m2} (ref >59)
Globulin, Total: 3 g/dL (ref 1.5–4.5)
Glucose: 97 mg/dL (ref 65–99)
Potassium: 4.4 mmol/L (ref 3.5–5.2)
Sodium: 140 mmol/L (ref 134–144)
Total Protein: 7 g/dL (ref 6.0–8.5)

## 2020-04-20 LAB — THYROID PANEL
Free Thyroxine Index: 2.1 (ref 1.2–4.9)
T3 Uptake Ratio: 26 % (ref 24–39)
T4, Total: 7.9 ug/dL (ref 4.5–12.0)

## 2020-04-20 LAB — LIPID PANEL
Chol/HDL Ratio: 4.1 ratio (ref 0.0–4.4)
Cholesterol, Total: 193 mg/dL (ref 100–199)
HDL: 47 mg/dL (ref >39)
LDL Chol Calc (NIH): 128 mg/dL — ABNORMAL HIGH (ref 0–99)
Triglycerides: 99 mg/dL (ref 0–149)
VLDL Cholesterol Cal: 18 mg/dL (ref 5–40)

## 2020-04-20 LAB — CBC
Hematocrit: 35.2 % (ref 34.0–46.6)
Hemoglobin: 11.2 g/dL (ref 11.1–15.9)
MCH: 27.8 pg (ref 26.6–33.0)
MCHC: 31.8 g/dL (ref 31.5–35.7)
MCV: 87 fL (ref 79–97)
Platelets: 254 10*3/uL (ref 150–450)
RBC: 4.03 x10E6/uL (ref 3.77–5.28)
RDW: 13.9 % (ref 11.7–15.4)
WBC: 3.5 10*3/uL (ref 3.4–10.8)

## 2020-04-20 LAB — HEPATITIS C ANTIBODY: Hep C Virus Ab: <0.1 s/co ratio (ref 0.0–0.9)

## 2020-04-20 LAB — HEMOGLOBIN A1C
Est. average glucose Bld gHb Est-mCnc: 120 mg/dL
Hgb A1c MFr Bld: 5.8 % — ABNORMAL HIGH (ref 4.8–5.6)

## 2020-04-20 LAB — HIV ANTIBODY (ROUTINE TESTING W REFLEX): HIV Screen 4th Generation wRfx: NONREACTIVE

## 2020-04-20 LAB — VITAMIN D 25 HYDROXY (VIT D DEFICIENCY, FRACTURES): Vit D, 25-Hydroxy: 14.9 ng/mL — ABNORMAL LOW (ref 30.0–100.0)

## 2020-04-20 NOTE — Telephone Encounter (Signed)
lvm for details of lab results.

## 2020-04-20 NOTE — Telephone Encounter (Signed)
Patient returning call about lab results. Please advise at [phone]

## 2020-04-20 NOTE — Progress Notes (Signed)
Overall your labs look good. Your Vitamin D did come back quite low. I would recommend taking 2000 IU of Over the counter Vitamin D3 with food daily.  Your cholesterol levels are also slightly elevated. Your A1c is elevated placing you in the pre-diabetes range. No medications are needed at this time, but continue to work on improving your diet and increasing exercise as able. Let me know if you would like a referral to a dietician to further discuss this.

## 2020-04-27 ENCOUNTER — Ambulatory Visit: Payer: BC Managed Care – PPO | Admitting: Endocrinology

## 2020-05-04 ENCOUNTER — Telehealth: Payer: Self-pay | Admitting: Dermatology

## 2020-05-04 NOTE — Telephone Encounter (Signed)
Patient is calling for a referral appointment from [facility] [place], but does not want to wait until July 2022 for appointment.  Patient would like referral sent back to [facility] [place].

## 2020-05-05 NOTE — Telephone Encounter (Signed)
Notes documented in referral and routed it back to referring office. 

## 2020-05-10 ENCOUNTER — Ambulatory Visit: Payer: BC Managed Care – PPO | Admitting: Family Medicine

## 2020-05-17 ENCOUNTER — Ambulatory Visit: Payer: BC Managed Care – PPO | Admitting: Family Medicine

## 2020-05-19 ENCOUNTER — Other Ambulatory Visit: Payer: Self-pay | Admitting: Family Medicine

## 2020-05-19 ENCOUNTER — Ambulatory Visit (INDEPENDENT_AMBULATORY_CARE_PROVIDER_SITE_OTHER): Payer: BC Managed Care – PPO | Admitting: Family Medicine

## 2020-05-19 ENCOUNTER — Other Ambulatory Visit (HOSPITAL_COMMUNITY)
Admission: RE | Admit: 2020-05-19 | Discharge: 2020-05-19 | Disposition: A | Discharge status: Home / Self Care | Payer: BC Managed Care – PPO | Source: Ambulatory Visit | Attending: Family Medicine | Admitting: Family Medicine

## 2020-05-19 ENCOUNTER — Other Ambulatory Visit: Payer: Self-pay

## 2020-05-19 ENCOUNTER — Encounter: Payer: Self-pay | Admitting: Family Medicine

## 2020-05-19 VITALS — BP 134/88 | HR 93 | Temp 97.6°F | Ht 65.0 in | Wt 185.0 lb

## 2020-05-19 DIAGNOSIS — Z124 Encounter for screening for malignant neoplasm of cervix: Secondary | ICD-10-CM | POA: Diagnosis present

## 2020-05-19 DIAGNOSIS — I824Z9 Acute embolism and thrombosis of unspecified deep veins of unspecified distal lower extremity: Secondary | ICD-10-CM | POA: Diagnosis not present

## 2020-05-19 DIAGNOSIS — Z1231 Encounter for screening mammogram for malignant neoplasm of breast: Secondary | ICD-10-CM | POA: Diagnosis not present

## 2020-05-19 DIAGNOSIS — Z113 Encounter for screening for infections with a predominantly sexual mode of transmission: Secondary | ICD-10-CM | POA: Diagnosis not present

## 2020-05-19 MED ORDER — RIVAROXABAN 20 MG PO TABS: 20.0000 mg | ORAL_TABLET | Freq: Every day | ORAL | 2 refills | Status: DC

## 2020-05-19 MED FILL — XARELTO 20 MG TABLET: 20 | 30 days supply | Qty: 30 | Fill #0

## 2020-05-19 NOTE — Progress Notes (Signed)
3/2/202211:43 AM  Gabriella Thomas 29-Nov-1975, 45 y.o., female [id number]  Chief Complaint  Patient presents with  . Gynecologic Exam    HPI:   Patient is a 45 y.o. female with past medical history significant for DVT and thyroid disease who presents today for pap.   Noticed she has thinning hair and a bald spot Had been using castor oil Las OV placed to dermatology, started steroid cream   Hyperthyroid Last OV placed referral to endocrinology Is currently not being treated for this Has seen a specialist in the past  Lab Results  Component Value Date   TSH 1.910 02/04/2020    DVT Last OV place referral to hematology to determine if needs to stay on Xarelto Xarelto started by ED in August 2021  Ultrasound: 12/3: Summary:  RIGHT:  - Findings suggest resolution of previously noted thrombus.  - There is no evidence of deep vein thrombosis in the lower extremity.  - No cystic structure found in the popliteal fossa.    Needs  Pap:Will do today LMP: monthly, no pain or heaviness Unknown family hx of menopause Colon cancer screen: ordered cologuard last OV Mammogram: ordered last OV No cancer HX in family  Health Maintenance  Topic Date Due  . COVID-19 Vaccine (1) Never done  . MAMMOGRAM  Never done  . PAP SMEAR-Modifier  Never done  . COLONOSCOPY (Pts 45-44yrs Insurance coverage will need to be confirmed)  Never done  . INFLUENZA VACCINE  06/17/2020 (Originally 10/19/2019)  . TETANUS/TDAP  02/03/2021 (Originally 01/09/1993)  . Hepatitis C Screening  Completed  . HIV Screening  Completed  . HPV VACCINES  Aged Out     Depression screen [facility] 2/9 04/19/2020 03/10/2020 02/26/2020  Decreased Interest 0 0 0  Down, Depressed, Hopeless 0 0 0  PHQ - 2 Score 0 0 0    Fall Risk  04/19/2020 03/10/2020 02/26/2020 02/04/2020 12/18/2019  Falls in the past year? 0 0 0 0 0  Number falls in past yr: 0 0 0 0 0  Injury with Fall? 0 0 0 0 -  Follow up Falls evaluation completed  Falls evaluation completed Falls evaluation completed Falls evaluation completed -     No Known Allergies  Prior to Admission medications   Medication Sig Start Date End Date Taking? Authorizing Provider  benzonatate (TESSALON) 100 MG capsule Take 1-2 capsules (100-200 mg total) by mouth 3 (three) times daily as needed for cough. 02/26/20   Aleiyah Halpin, Azalee Course, FNP  Dextromethorphan-guaiFENesin (MUCINEX DM MAXIMUM STRENGTH) 60-1200 MG TB12 Take 1 tablet by mouth every 12 (twelve) hours. 02/26/20   Ethie Curless, Azalee Course, FNP  rivaroxaban (XARELTO) 20 MG TABS tablet Take 1 tablet (20 mg total) by mouth daily with supper. 02/04/20   Crit Obremski, Azalee Course, FNP    Past Medical History:  Diagnosis Date  . BMI 30.0-30.9,adult   . DVT (deep venous thrombosis) (HCC)   . Thyroid disease     Past Surgical History:  Procedure Laterality Date  . CESAREAN SECTION  07/12/2005   06/12/2008, 11/15/2009  . TUBAL LIGATION      Social History   Tobacco Use  . Smoking status: Never Smoker  . Smokeless tobacco: Never Used  Substance Use Topics  . Alcohol use: Never    History reviewed. No pertinent family history.  Review of Systems  Constitutional: Negative for chills, fever and malaise/fatigue.  Eyes: Negative for blurred vision and double vision.  Respiratory: Positive for cough (post covid). Negative for shortness  of breath and wheezing.   Cardiovascular: Negative for chest pain, palpitations and leg swelling.  Gastrointestinal: Negative for abdominal pain, blood in stool, constipation, diarrhea, heartburn, nausea and vomiting.  Genitourinary: Negative for dysuria, frequency and hematuria.  Musculoskeletal: Negative for back pain and joint pain.  Skin: Negative for itching and rash.       Scalp hair loss  Neurological: Negative for dizziness, weakness and headaches.     OBJECTIVE:  Today's Vitals   05/19/20 1125  BP: 134/88  Pulse: 93  Temp: 97.6 F (36.4 C)  SpO2: 99%  Weight: 185 lb (83.9 kg)   Height: 5\' 5"  (1.651 m)   Body mass index is 30.79 kg/m.   Physical Exam Constitutional:      General: She is not in acute distress.    Appearance: Normal appearance. She is not ill-appearing.  HENT:     Head: Normocephalic. Hair is abnormal (large patch of thinning hair to top of scalp, assymetrical borders, no errythema).  Neck:     Thyroid: No thyroid mass, thyromegaly or thyroid tenderness.  Cardiovascular:     Rate and Rhythm: Normal rate and regular rhythm.     Pulses: Normal pulses.     Heart sounds: Normal heart sounds. No murmur heard. No friction rub. No gallop.   Pulmonary:     Effort: Pulmonary effort is normal. No respiratory distress.     Breath sounds: Normal breath sounds. No stridor. No wheezing, rhonchi or rales.  Abdominal:     General: Bowel sounds are normal.     Palpations: Abdomen is soft.     Tenderness: There is no abdominal tenderness.  Musculoskeletal:     Right lower leg: No edema.     Left lower leg: No edema.  Skin:    General: Skin is warm and dry.  Neurological:     Mental Status: She is alert and oriented to person, place, and time.  Psychiatric:        Mood and Affect: Mood normal.        Behavior: Behavior normal.   Pelvic exam: normal external genitalia, vulva, vagina, cervix, uterus and adnexa, VULVA: normal appearing vulva with no masses, tenderness or lesions, VAGINA: normal appearing vagina with normal color and discharge, no lesions, CERVIX: normal appearing cervix without  Lesions, with moderate thin white discharge UTERUS: uterus is normal size, shape, consistency and nontender, ADNEXA: normal adnexa in size, nontender and no masses, PAP: Pap smear done today, thin-prep method, DNA probe for chlamydia and GC obtained, HPV test, exam chaperoned by [facility], LPN.   No results found for this or any previous visit (from the past 24 hour(s)).  No results found.   ASSESSMENT and PLAN  Problem List Items Addressed This Visit       Cardiovascular and Mediastinum   Deep vein thrombosis (DVT) of distal vein of lower extremity (HCC)   Relevant Medications   rivaroxaban (XARELTO) 20 MG TABS tablet    Other Visit Diagnoses    Pap smear for cervical cancer screening    -  Primary   Relevant Orders   Cytology - PAP([facility])   Encounter for screening mammogram for malignant neoplasm of breast       Relevant Orders   MM 3D SCREEN BREAST BILATERAL      Plan . Pap done will follow up with results . Order placed for mammogram and cologuard last visit   Return in about 3 months (around 08/19/2020).    Paulanthony Gleaves,  FNP-BC [facility] [place], [place] [address] Ph.  [phone] Fax [phone]

## 2020-05-19 NOTE — Patient Instructions (Addendum)
Plantar Fasciitis Rehab Ask your health care provider which exercises are safe for you. Do exercises exactly as told by your health care provider and adjust them as directed. It is normal to feel mild stretching, pulling, tightness, or discomfort as you do these exercises. Stop right away if you feel sudden pain or your pain gets worse. Do not begin these exercises until told by your health care provider. Stretching and range-of-motion exercises These exercises warm up your muscles and joints and improve the movement and flexibility of your foot. These exercises also help to relieve pain. Plantar fascia stretch 1. Sit with your left / right leg crossed over your opposite knee. 2. Hold your heel with one hand with that thumb near your arch. With your other hand, hold your toes and gently pull them back toward the top of your foot. You should feel a stretch on the base (bottom) of your toes, or the bottom of your foot (plantar fascia), or both. 3. Hold this stretch for__________ seconds. 4. Slowly release your toes and return to the starting position. Repeat __________ times. Complete this exercise __________ times a day.   Gastrocnemius stretch, standing This exercise is also called a calf (gastroc) stretch. It stretches the muscles in the back of the upper calf. 1. Stand with your hands against a wall. 2. Extend your left / right leg behind you, and bend your front knee slightly. 3. Keeping your heels on the floor, your toes facing forward, and your back knee straight, shift your weight toward the wall. Do not arch your back. You should feel a gentle stretch in your upper calf. 4. Hold this position for __________ seconds. Repeat __________ times. Complete this exercise __________ times a day.   Soleus stretch, standing This exercise is also called a calf (soleus) stretch. It stretches the muscles in the back of the lower calf. 1. Stand with your hands against a wall. 2. Extend your left /  right leg behind you, and bend your front knee slightly. 3. Keeping your heels on the floor and your toes facing forward, bend your back knee and shift your weight slightly over your back leg. You should feel a gentle stretch deep in your lower calf. 4. Hold this position for __________ seconds. Repeat __________ times. Complete this exercise __________ times a day. Gastroc and soleus stretch, standing step This exercise stretches the muscles in the back of the lower leg. These muscles are in the upper calf (gastrocnemius) and the lower calf (soleus). 1. Stand with the ball of your left / right foot on the front of a step. The ball of your foot is on the walking surface, right under your toes. 2. Keep your other foot firmly on the same step. 3. Hold on to the wall or a railing for balance. 4. Slowly lift your other foot, allowing your body weight to press your heel down over the edge of the front of the step. Keep knee straight and unbent. You should feel a stretch in your calf. 5. Hold this position for __________ seconds. 6. Return both feet to the step. 7. Repeat this exercise with a slight bend in your left / right knee. Repeat __________ times with your left / right knee straight and __________ times with your left / right knee bent. Complete this exercise __________ times a day. Balance exercise This exercise builds your balance and strength control of your arch to help take pressure off your plantar fascia. Single leg stand If  this exercise is too easy, you can try it with your eyes closed or while standing on a pillow. 1. Without shoes, stand near a railing or in a doorway. You may hold on to the railing or door frame as needed. 2. Stand on your left / right foot. Keep your big toe down on the floor and lift the arch of your foot. You should feel a stretch across the bottom of your foot and your arch. Do not let your foot roll inward. 3. Hold this position for __________ seconds. Repeat  __________ times. Complete this exercise __________ times a day. This information is not intended to replace advice given to you by your health care provider. Make sure you discuss any questions you have with your health care provider. Document Revised: 12/18/2019 Document Reviewed: 12/18/2019 Elsevier Patient Education  2021 [employer].  How to Use Compression Stockings Compression stockings are elastic socks that squeeze the legs. They help increase blood flow (circulation) to the legs, decrease swelling in the legs, and reduce the chance of developing blood clots in the lower legs. Compression stockings are often used by people who:  Are recovering from surgery.  Have poor circulation in their legs.  Tend to get blood clots in their legs.  Have bulging (varicose) veins.  Sit or stay in bed for long periods of time. Follow instructions from your health care provider about how and when to wear your compression stockings. How to wear compression stockings Before you put on your compression stockings:  Make sure that they are the correct size and degree of compression. If you do not know your size or required grade of compression, ask your health care provider and follow the manufacturer's instructions that come with the stockings.  Make sure that they are clean, dry, and in good condition.  Check them for rips and tears. Do not put them on if they are ripped or torn. Put your stockings on first thing in the morning, before you get out of bed. Keep them on for as long as your health care provider advises. When you are wearing your stockings:  Keep them as smooth as possible. Do not allow them to bunch up. It is especially important to prevent the stockings from bunching up around your toes or behind your knees.  Do not roll the stockings downward and leave them rolled down. This can decrease blood flow to your leg.  Change them right away if they become wet or dirty. When you take  off your stockings, inspect your legs and feet. Check for:  Open sores.  Red spots.  Swelling. General tips  Do not stop wearing compression stockings without talking to your health care provider first.  Wash your stockings every day with mild detergent in cold or warm water. Do not use bleach. Air-dry your stockings or dry them in a clothes dryer on low heat. It may be helpful to have two pairs so that you have a pair to wear while the other is being washed.  Replace your stockings every 3-6 months.  If skin moisturizing is part of your treatment plan, apply lotion or cream at night so that your skin will be dry when you put on the stockings in the morning. It is harder to put the stockings on when you have lotion on your legs or feet.  Wear nonskid shoes or slip-resistant socks when walking while wearing compression stockings. Contact a health care provider and remove your stockings if you have:  A feeling of pins and needles in your feet or legs.  Open sores, red spots, or other skin changes on your feet or legs.  Swelling or pain that gets worse. Get help right away if you have:  Numbness or tingling in your lower legs that does not get better right after you take the stockings off.  Toes or feet that are unusually cold or turn a bluish color.  A warm or red area on your leg.  New swelling or soreness in your leg.  Shortness of breath.  Chest pain.  A fast or irregular heartbeat.  Light-headedness.  Dizziness. Summary  Compression stockings are elastic socks that squeeze the legs.  They help increase blood flow (circulation) to the legs, decrease swelling in the legs, and reduce the chance of developing blood clots in the lower legs.  Follow instructions from your health care provider about how and when to wear your compression stockings.  Do not stop wearing your compression stockings without talking to your health care provider first. This information is not  intended to replace advice given to you by your health care provider. Make sure you discuss any questions you have with your health care provider. Document Revised: 07/09/2019 Document Reviewed: 07/09/2019 Elsevier Patient Education  2021 Else[employer] Inc.  Pap Test Why am I having this test? A Pap test, also called a Pap smear, is a screening test to check for signs of:  Cancer of the vagina, cervix, and uterus. The cervix is the lower part of the uterus that opens into the vagina.  Infection.  Changes that may be a sign that cancer is developing (precancerous changes). Women need this test on a regular basis. In general, you should have a Pap test every 3 years until you reach menopause or age 55. Women aged 30-60 may choose to have their Pap test done at the same time as an HPV (human papillomavirus) test every 5 years (instead of every 3 years). Your health care provider may recommend having Pap tests more or less often depending on your medical conditions and past Pap test results. What kind of sample is taken? Your health care provider will collect a sample of cells from the surface of your cervix. This will be done using a small cotton swab, plastic spatula, or brush. This sample is often collected during a pelvic exam, when you are lying on your back on an exam table with feet in footrests (stirrups). In some cases, fluids (secretions) from the cervix or vagina may also be collected.   How do I prepare for this test?  Be aware of where you are in your menstrual cycle. If you are menstruating on the day of the test, you may be asked to reschedule.  You may need to reschedule if you have a known vaginal infection on the day of the test.  Follow instructions from your health care provider about: ? Changing or stopping your regular medicines. Some medicines can cause abnormal test results, such as digitalis and tetracycline. ? Avoiding douching or taking a bath the day before or the day of  the test. Tell a health care provider about:  Any allergies you have.  All medicines you are taking, including vitamins, herbs, eye drops, creams, and over-the-counter medicines.  Any blood disorders you have.  Any surgeries you have had.  Any medical conditions you have.  Whether you are pregnant or may be pregnant. How are the results reported? Your test results will be reported as  either abnormal or normal. A false-positive result can occur. A false positive is incorrect because it means that a condition is present when it is not. A false-negative result can occur. A false negative is incorrect because it means that a condition is not present when it is. What do the results mean? A normal test result means that you do not have signs of cancer of the vagina, cervix, or uterus. An abnormal result may mean that you have:  Cancer. A Pap test by itself is not enough to diagnose cancer. You will have more tests done in this case.  Precancerous changes in your vagina, cervix, or uterus.  Inflammation of the cervix.  An STD (sexually transmitted disease).  A fungal infection.  A parasite infection. Talk with your health care provider about what your results mean. Questions to ask your health care provider Ask your health care provider, or the department that is doing the test:  When will my results be ready?  How will I get my results?  What are my treatment options?  What other tests do I need?  What are my next steps? Summary  In general, women should have a Pap test every 3 years until they reach menopause or age 27.  Your health care provider will collect a sample of cells from the surface of your cervix. This will be done using a small cotton swab, plastic spatula, or brush.  In some cases, fluids (secretions) from the cervix or vagina may also be collected. This information is not intended to replace advice given to you by your health care provider. Make sure you  discuss any questions you have with your health care provider. Document Revised: 11/12/2019 Document Reviewed: 11/07/2019 Elsevier Patient Education  2021 [employer].

## 2020-05-25 LAB — CYTOLOGY - PAP
Adequacy: ABSENT
Chlamydia: NEGATIVE
Comment: NEGATIVE
Comment: NEGATIVE
Comment: NEGATIVE
Comment: NEGATIVE
Comment: NORMAL
Diagnosis: NEGATIVE
HSV1: NEGATIVE
HSV2: NEGATIVE
High risk HPV: NEGATIVE
Neisseria Gonorrhea: NEGATIVE
Trichomonas: NEGATIVE

## 2020-05-25 NOTE — Progress Notes (Signed)
Your pap came back HPV negative and negative for abnormalities. No further screening for 5 years. Let me know if you have any questions or concerns.

## 2020-06-11 ENCOUNTER — Emergency Department (HOSPITAL_COMMUNITY)
Admission: EM | Admit: 2020-06-11 | Discharge: 2020-06-11 | Disposition: A | Discharge status: Home / Self Care | Payer: BC Managed Care – PPO | Attending: Emergency Medicine | Admitting: Emergency Medicine

## 2020-06-11 ENCOUNTER — Emergency Department (HOSPITAL_COMMUNITY): Payer: BC Managed Care – PPO

## 2020-06-11 ENCOUNTER — Other Ambulatory Visit: Payer: Self-pay

## 2020-06-11 ENCOUNTER — Other Ambulatory Visit (HOSPITAL_COMMUNITY): Payer: Self-pay | Admitting: Student

## 2020-06-11 ENCOUNTER — Encounter (HOSPITAL_COMMUNITY): Payer: Self-pay

## 2020-06-11 DIAGNOSIS — R0789 Other chest pain: Secondary | ICD-10-CM | POA: Insufficient documentation

## 2020-06-11 DIAGNOSIS — M25562 Pain in left knee: Secondary | ICD-10-CM | POA: Insufficient documentation

## 2020-06-11 DIAGNOSIS — Z7901 Long term (current) use of anticoagulants: Secondary | ICD-10-CM | POA: Insufficient documentation

## 2020-06-11 DIAGNOSIS — S8992XA Unspecified injury of left lower leg, initial encounter: Secondary | ICD-10-CM | POA: Diagnosis present

## 2020-06-11 DIAGNOSIS — R519 Headache, unspecified: Secondary | ICD-10-CM | POA: Insufficient documentation

## 2020-06-11 DIAGNOSIS — M25512 Pain in left shoulder: Secondary | ICD-10-CM | POA: Diagnosis not present

## 2020-06-11 DIAGNOSIS — Y92410 Unspecified street and highway as the place of occurrence of the external cause: Secondary | ICD-10-CM | POA: Diagnosis not present

## 2020-06-11 DIAGNOSIS — R42 Dizziness and giddiness: Secondary | ICD-10-CM | POA: Diagnosis not present

## 2020-06-11 DIAGNOSIS — R109 Unspecified abdominal pain: Secondary | ICD-10-CM | POA: Insufficient documentation

## 2020-06-11 DIAGNOSIS — S8002XA Contusion of left knee, initial encounter: Secondary | ICD-10-CM | POA: Insufficient documentation

## 2020-06-11 DIAGNOSIS — L539 Erythematous condition, unspecified: Secondary | ICD-10-CM | POA: Insufficient documentation

## 2020-06-11 LAB — CBC WITH DIFFERENTIAL/PLATELET
Abs Immature Granulocytes: 0.04 10*3/uL (ref 0.00–0.07)
Basophils Absolute: 0 10*3/uL (ref 0.0–0.1)
Basophils Relative: 0 %
Eosinophils Absolute: 0.1 10*3/uL (ref 0.0–0.5)
Eosinophils Relative: 1 %
HCT: 36.4 % (ref 36.0–46.0)
Hemoglobin: 11.1 g/dL — ABNORMAL LOW (ref 12.0–15.0)
Immature Granulocytes: 1 %
Lymphocytes Relative: 23 %
Lymphs Abs: 1.5 10*3/uL (ref 0.7–4.0)
MCH: 27.5 pg (ref 26.0–34.0)
MCHC: 30.5 g/dL (ref 30.0–36.0)
MCV: 90.3 fL (ref 80.0–100.0)
Monocytes Absolute: 0.3 10*3/uL (ref 0.1–1.0)
Monocytes Relative: 4 %
Neutro Abs: 4.5 10*3/uL (ref 1.7–7.7)
Neutrophils Relative %: 71 %
Platelets: 229 10*3/uL (ref 150–400)
RBC: 4.03 MIL/uL (ref 3.87–5.11)
RDW: 13.1 % (ref 11.5–15.5)
WBC: 6.4 10*3/uL (ref 4.0–10.5)
nRBC: 0 % (ref 0.0–0.2)

## 2020-06-11 LAB — COMPREHENSIVE METABOLIC PANEL
ALT: 13 U/L (ref 0–44)
AST: 18 U/L (ref 15–41)
Albumin: 3.8 g/dL (ref 3.5–5.0)
Alkaline Phosphatase: 52 U/L (ref 38–126)
Anion gap: 6 (ref 5–15)
BUN: 10 mg/dL (ref 6–20)
CO2: 24 mmol/L (ref 22–32)
Calcium: 9.1 mg/dL (ref 8.9–10.3)
Chloride: 107 mmol/L (ref 98–111)
Creatinine, Ser: 0.51 mg/dL (ref 0.44–1.00)
GFR, Estimated: >60 mL/min (ref >60)
Glucose, Bld: 104 mg/dL — ABNORMAL HIGH (ref 70–99)
Potassium: 3.6 mmol/L (ref 3.5–5.1)
Sodium: 137 mmol/L (ref 135–145)
Total Bilirubin: 0.6 mg/dL (ref 0.3–1.2)
Total Protein: 7.5 g/dL (ref 6.5–8.1)

## 2020-06-11 LAB — I-STAT BETA HCG BLOOD, ED (MC, WL, AP ONLY): I-stat hCG, quantitative: <5 m[IU]/mL (ref <5)

## 2020-06-11 MED ORDER — HYDROCODONE-ACETAMINOPHEN 5-325 MG PO TABS
1.0000 | ORAL_TABLET | Freq: Once | ORAL | Status: AC
  Administered 2020-06-11: 1 via ORAL
  Filled 2020-06-11: qty 1

## 2020-06-11 MED ORDER — METHOCARBAMOL 500 MG PO TABS: 500.0000 mg | ORAL_TABLET | Freq: Two times a day (BID) | ORAL | 0 refills | Status: DC

## 2020-06-11 MED ORDER — ACETAMINOPHEN 500 MG PO TABS
1000.0000 mg | ORAL_TABLET | Freq: Once | ORAL | Status: AC
  Administered 2020-06-11: 1000 mg via ORAL
  Filled 2020-06-11: qty 2

## 2020-06-11 MED ORDER — IOHEXOL 300 MG/ML  SOLN
100.0000 mL | Freq: Once | INTRAMUSCULAR | Status: AC | PRN
  Administered 2020-06-11: 100 mL via INTRAVENOUS
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
## 2020-06-11 NOTE — Discharge Instructions (Signed)
The pain you are experiencing is likely due to muscle strain, you may take tylenol and Robaxin as needed for pain management.  Use Ace wrap over your knee, if knee pain is not improving after 1 week please follow-up with orthopedics.  You may also use ice and heat, and over-the-counter remedies such as Biofreeze gel or salon pas lidocaine patches. The muscle soreness should improve over the next week. Follow up with your family doctor in the next week for a recheck if you are still having symptoms. Return to ED if pain is worsening, you develop weakness or numbness of extremities, or new or concerning symptoms develop.
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
## 2020-06-19 ENCOUNTER — Other Ambulatory Visit: Payer: Self-pay

## 2020-08-25 ENCOUNTER — Ambulatory Visit: Payer: BC Managed Care – PPO | Admitting: Family Medicine

## 2020-09-27 ENCOUNTER — Ambulatory Visit: Payer: BC Managed Care – PPO | Admitting: Family Medicine

## 2021-02-21 ENCOUNTER — Ambulatory Visit: Payer: BC Managed Care – PPO | Admitting: Family Medicine

## 2021-04-29 ENCOUNTER — Other Ambulatory Visit (HOSPITAL_COMMUNITY): Payer: Self-pay | Admitting: Family Medicine

## 2021-04-29 ENCOUNTER — Other Ambulatory Visit: Payer: Self-pay

## 2021-04-29 ENCOUNTER — Ambulatory Visit (HOSPITAL_COMMUNITY)
Admission: RE | Admit: 2021-04-29 | Discharge: 2021-04-29 | Disposition: A | Discharge status: Home / Self Care | Payer: BC Managed Care – PPO | Source: Ambulatory Visit | Attending: Family Medicine | Admitting: Family Medicine

## 2021-04-29 DIAGNOSIS — M79605 Pain in left leg: Secondary | ICD-10-CM | POA: Diagnosis present

## 2021-06-07 ENCOUNTER — Other Ambulatory Visit: Payer: Self-pay | Admitting: Sports Medicine

## 2021-06-07 DIAGNOSIS — M25562 Pain in left knee: Secondary | ICD-10-CM

## 2021-07-20 ENCOUNTER — Ambulatory Visit
Admission: RE | Admit: 2021-07-20 | Discharge: 2021-07-20 | Disposition: A | Discharge status: Home / Self Care | Payer: BC Managed Care – PPO | Source: Ambulatory Visit | Attending: Sports Medicine | Admitting: Sports Medicine

## 2021-07-20 DIAGNOSIS — M25562 Pain in left knee: Secondary | ICD-10-CM

## 2022-12-20 IMAGING — MR MR KNEE*L* W/O CM
5 of 7 series · 22 of 40 positions shown · non-contrast
Comparison: Left knee radiograph 06/11/2020

CLINICAL DATA: Pain and swelling

EXAM:
MRI OF THE LEFT KNEE WITHOUT CONTRAST
TECHNIQUE: Multiplanar, multisequence MR imaging of the knee was performed. No
intravenous contrast was administered.

[Series 3: T2 fat-sat · axial · 4.0mm · 0.50mm/px · z∈[-35,+80]mm · 5 of 24 slices shown (1 of 2)]
[im 1/24]
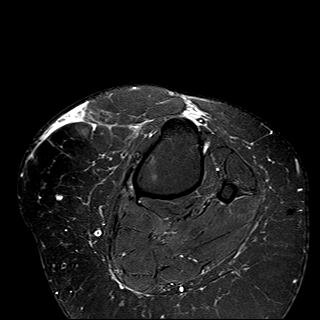
[im 6/24]
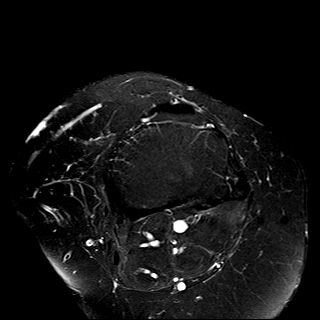
[im 12/24]
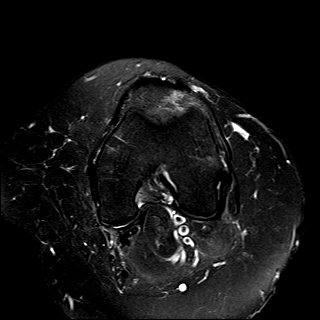
[im 18/24]
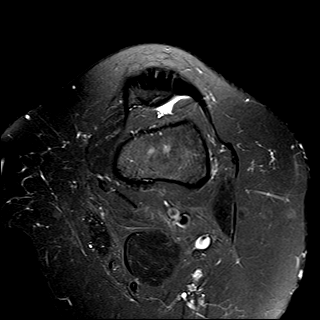
[im 24/24]
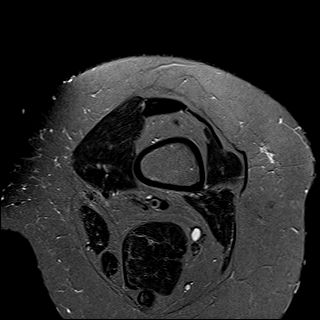

[Series 5: T2 fat-sat · coronal · 4.0mm · 0.29mm/px · 1 of 22 slices shown (2 of 2)]
[im 1/22]
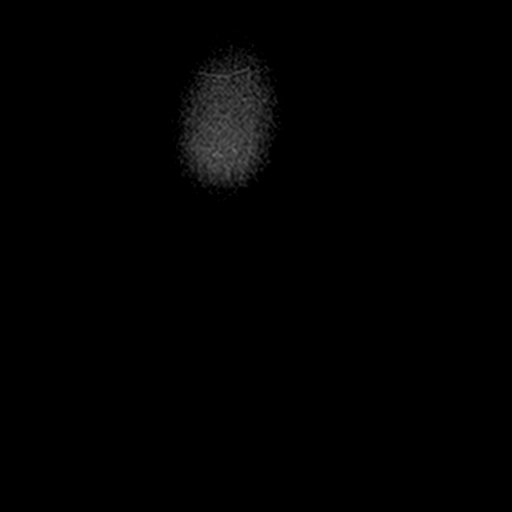

[Series 6: PD fat-sat · coronal · 4.0mm · 0.29mm/px · 6 of 22 slices shown (1 of 3)]
[im 1/22]
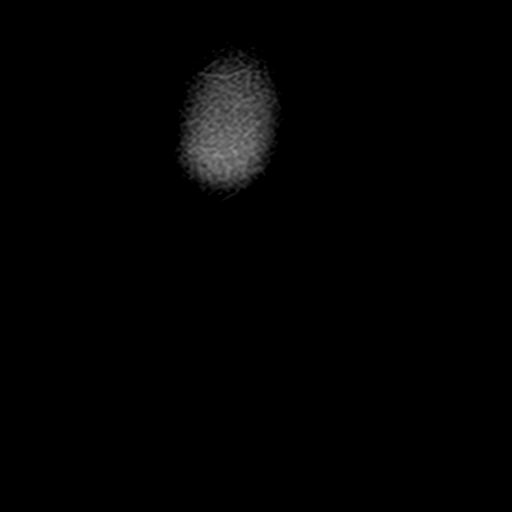
[im 5/22]
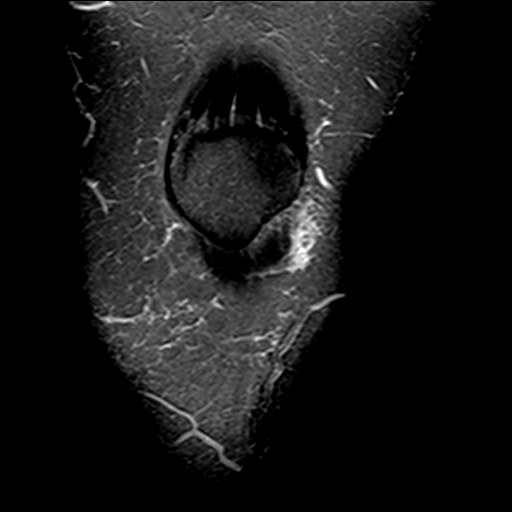
[im 9/22]
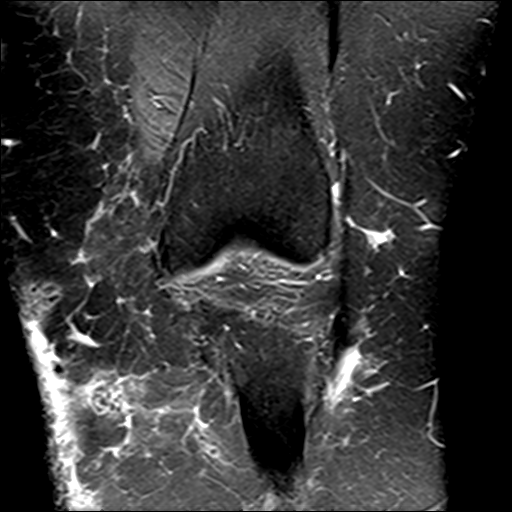
[im 13/22]
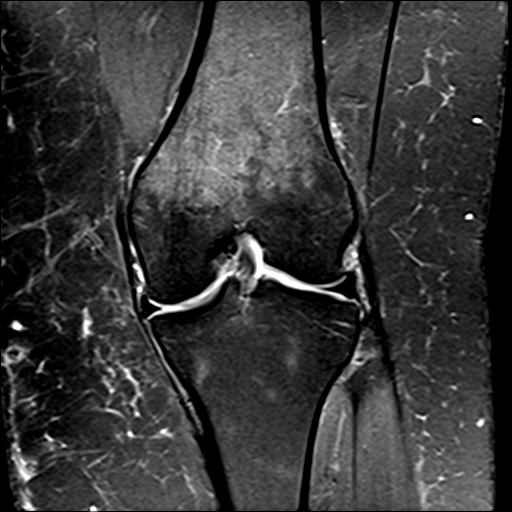
[im 17/22]
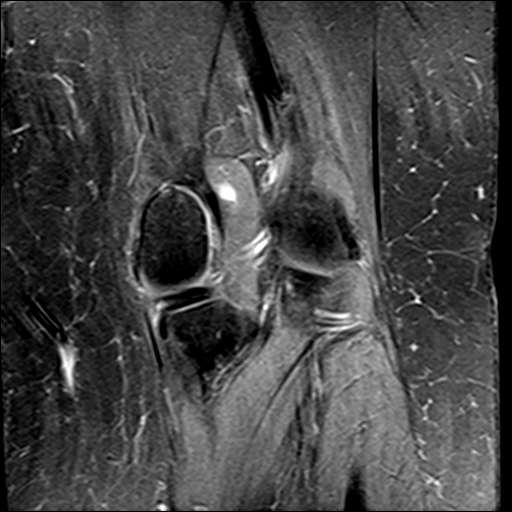
[im 22/22]
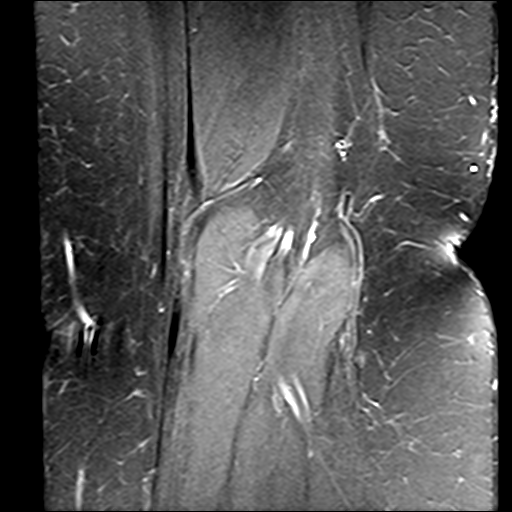

[Series 8: PD fat-sat · sagittal · 3.0mm · 0.29mm/px · 7 of 27 slices shown (2 of 3)]
[im 1/27]
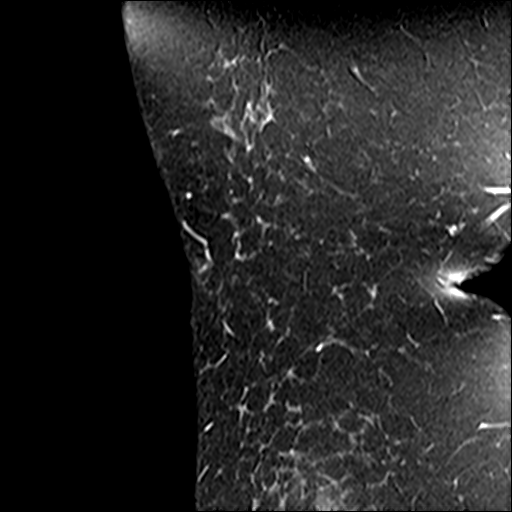
[im 5/27]
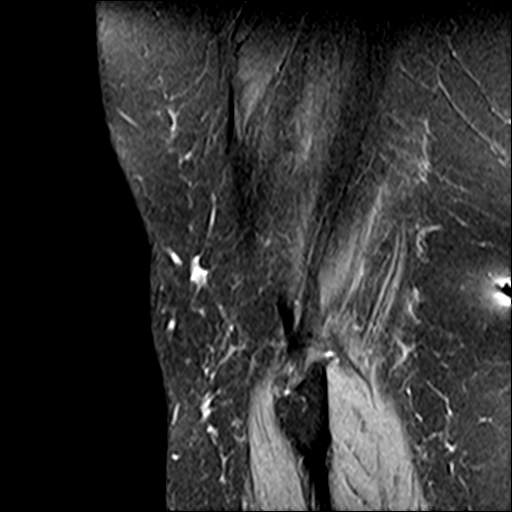
[im 9/27]
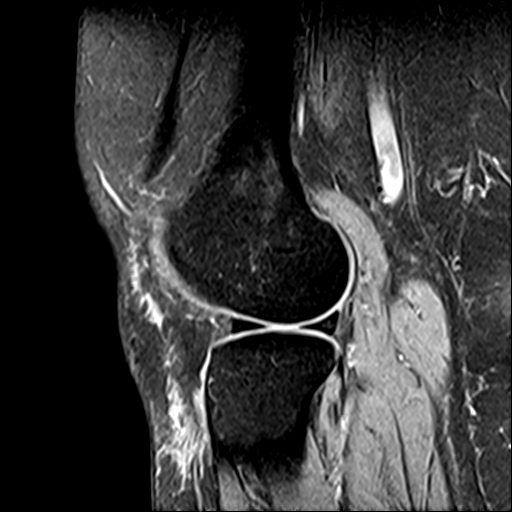
[im 14/27]
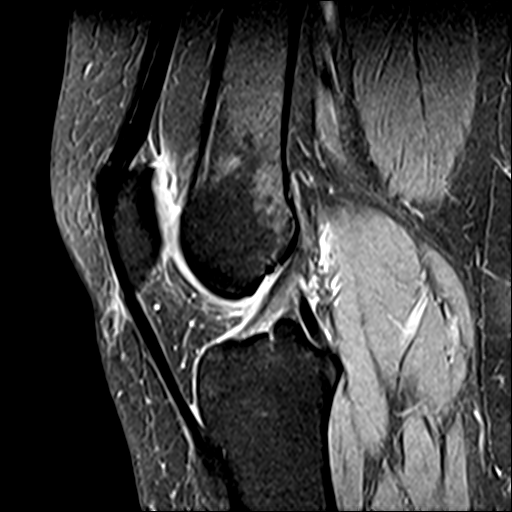
[im 18/27]
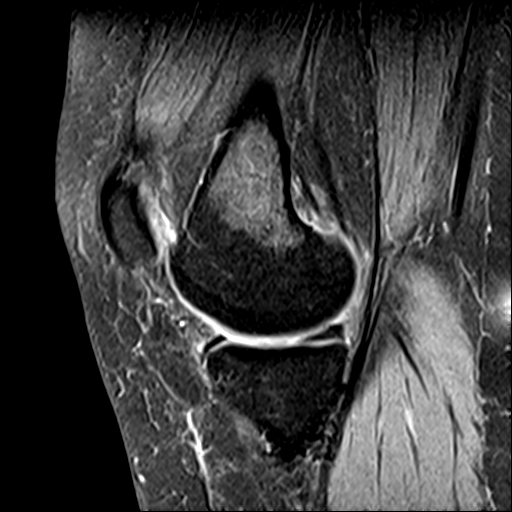
[im 22/27]
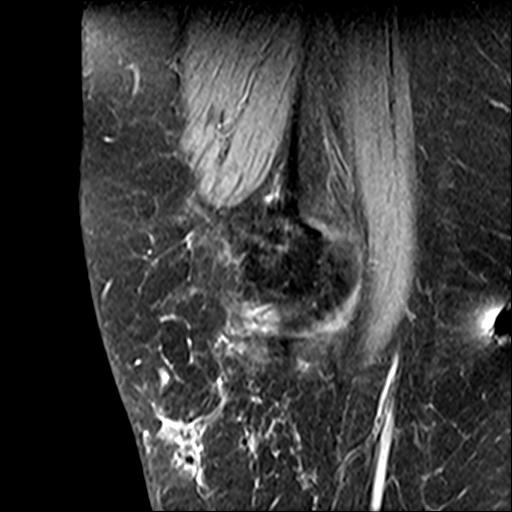
[im 27/27]
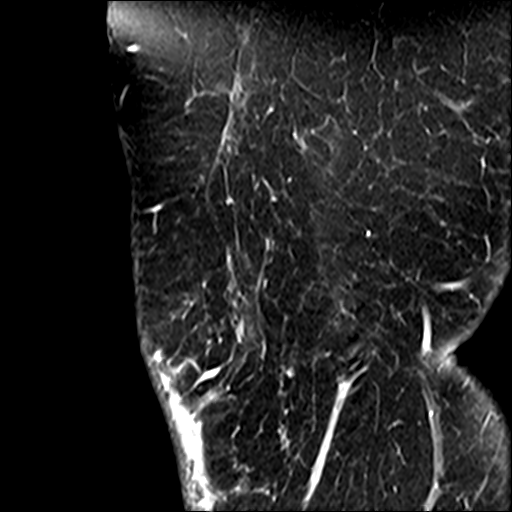

[Series 9: PD fat-sat · oblique · 2.0mm · 0.29mm/px · 3 of 11 slices shown (3 of 3)]
[im 1/11]
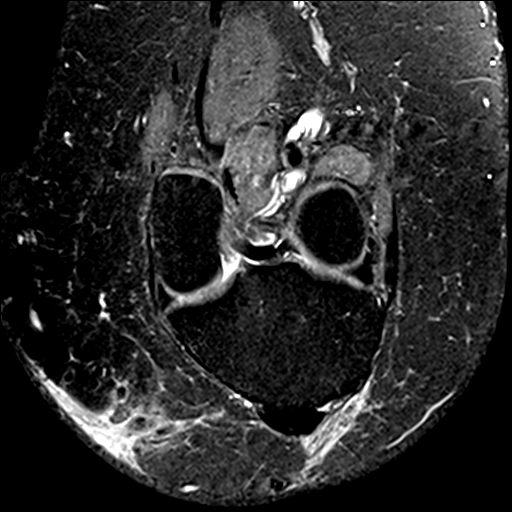
[im 6/11]
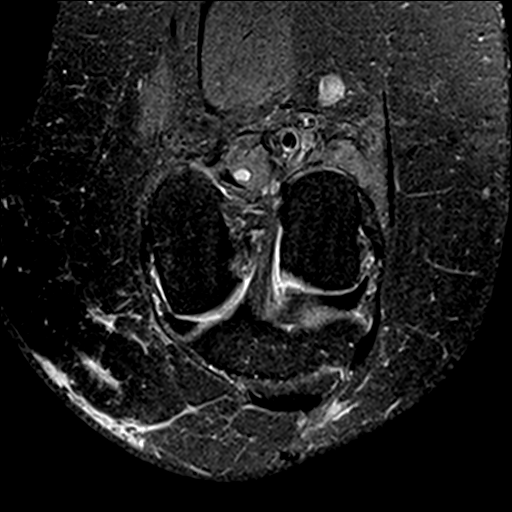
[im 11/11]
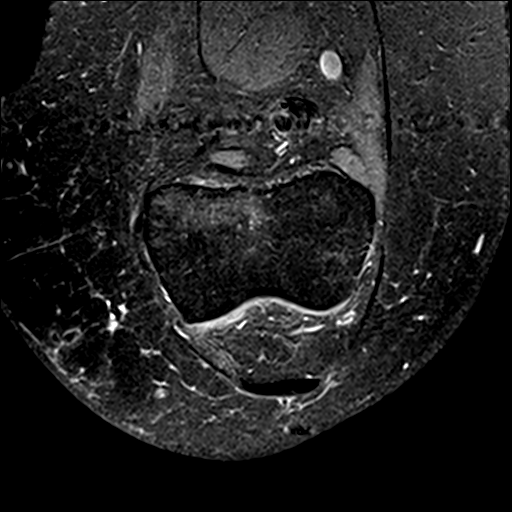

[22 of 40 positions shown; findings below may reference images not displayed]

FINDINGS: MENISCI

Medial: Intact.

Lateral: Intact.

LIGAMENTS

Cruciates: ACL and PCL are intact.

Collaterals: Medial collateral ligament is intact. Lateral
collateral ligament complex is intact.

CARTILAGE

Patellofemoral: There is mild partial-thickness cartilage loss of
the median patellar ridge.

Medial: There is focal intermediate grade cartilage defect along the
weight-bearing no femoral condyle, measuring 10 mm
anterior-posterior and 4 mm in width (coronal T2 image 10, sagittal
T2 image 20). Additional intermediate grade 4 mm chondral defect
anteriorly (coronal T2 image 12).

Lateral:  Mild chondrosis.

JOINT: No significant joint effusion. There is mild edema within the
superolateral aspect of Hoffa's fat pad between the patellar tendon
and lateral femoral condyle (axial T2 image 13).

POPLITEAL FOSSA: No Baker's cyst.

EXTENSOR MECHANISM: Intact quadriceps tendon. Intact patellar
tendon. TT-TG distance measures 10 mm. No patella Alta. Normal
trochlear depth.

BONES: No aggressive osseous lesion. No fracture or dislocation.
Tricompartment osteophyte formation.

Other: No fluid collection or hematoma. Muscles are normal. There is
mild anteromedial soft tissue swelling.
IMPRESSION: Tricompartment osteoarthritis, worst in the medial compartment,
cartilaginous abnormalities as described above.

No evidence of meniscus tear or ligamentous injury.

Findings of mild patellar tendon-lateral femoral condyle impingement
syndrome.

## 2023-02-23 ENCOUNTER — Other Ambulatory Visit: Payer: Self-pay | Admitting: Family Medicine

## 2023-02-23 DIAGNOSIS — Z1231 Encounter for screening mammogram for malignant neoplasm of breast: Secondary | ICD-10-CM
